# Patient Record
Sex: Female | Born: 1980 | Race: White | Hispanic: Yes | Marital: Single | Smoking: Never smoker
Health system: Southern US, Community
[De-identification: ages and names within clinical notes are randomized; demographics above are authoritative.]

## PROBLEM LIST (undated history)

## (undated) DIAGNOSIS — K649 Unspecified hemorrhoids: Secondary | ICD-10-CM

## (undated) DIAGNOSIS — I8390 Asymptomatic varicose veins of unspecified lower extremity: Secondary | ICD-10-CM

## (undated) DIAGNOSIS — I8290 Acute embolism and thrombosis of unspecified vein: Secondary | ICD-10-CM

## (undated) DIAGNOSIS — R51 Headache: Secondary | ICD-10-CM

## (undated) HISTORY — DX: Acute embolism and thrombosis of unspecified vein: I82.90

## (undated) HISTORY — DX: Asymptomatic varicose veins of unspecified lower extremity: I83.90

## (undated) HISTORY — DX: Headache: R51

---

## 1999-11-03 ENCOUNTER — Encounter: Admission: RE | Admit: 1999-11-03 | Discharge: 1999-11-03 | Payer: Self-pay | Admitting: Family Medicine

## 1999-11-10 ENCOUNTER — Encounter: Admission: RE | Admit: 1999-11-10 | Discharge: 1999-11-10 | Payer: Self-pay | Admitting: Family Medicine

## 2000-04-26 ENCOUNTER — Encounter: Admission: RE | Admit: 2000-04-26 | Discharge: 2000-04-26 | Payer: Self-pay | Admitting: Family Medicine

## 2000-08-23 ENCOUNTER — Encounter (INDEPENDENT_AMBULATORY_CARE_PROVIDER_SITE_OTHER): Payer: Self-pay | Admitting: *Deleted

## 2000-08-23 LAB — CONVERTED CEMR LAB

## 2000-08-31 ENCOUNTER — Other Ambulatory Visit: Admission: RE | Admit: 2000-08-31 | Discharge: 2000-08-31 | Payer: Self-pay | Admitting: Gynecology

## 2000-09-13 ENCOUNTER — Encounter: Admission: RE | Admit: 2000-09-13 | Discharge: 2000-09-13 | Payer: Self-pay | Admitting: Family Medicine

## 2000-09-17 ENCOUNTER — Encounter: Admission: RE | Admit: 2000-09-17 | Discharge: 2000-09-17 | Payer: Self-pay | Admitting: Family Medicine

## 2000-09-17 ENCOUNTER — Encounter: Payer: Self-pay | Admitting: Family Medicine

## 2000-09-27 ENCOUNTER — Encounter: Admission: RE | Admit: 2000-09-27 | Discharge: 2000-09-27 | Payer: Self-pay | Admitting: Family Medicine

## 2001-03-26 ENCOUNTER — Encounter: Payer: Self-pay | Admitting: Emergency Medicine

## 2001-03-26 ENCOUNTER — Emergency Department (HOSPITAL_COMMUNITY): Admission: EM | Admit: 2001-03-26 | Discharge: 2001-03-26 | Payer: Self-pay | Admitting: *Deleted

## 2001-05-02 ENCOUNTER — Encounter: Admission: RE | Admit: 2001-05-02 | Discharge: 2001-05-02 | Payer: Self-pay | Admitting: Family Medicine

## 2001-05-09 ENCOUNTER — Encounter: Admission: RE | Admit: 2001-05-09 | Discharge: 2001-05-09 | Payer: Self-pay | Admitting: Family Medicine

## 2001-06-01 ENCOUNTER — Other Ambulatory Visit: Admission: RE | Admit: 2001-06-01 | Discharge: 2001-06-01 | Payer: Self-pay | Admitting: Gynecology

## 2002-01-16 ENCOUNTER — Encounter: Admission: RE | Admit: 2002-01-16 | Discharge: 2002-01-16 | Payer: Self-pay | Admitting: Sports Medicine

## 2002-04-25 ENCOUNTER — Other Ambulatory Visit: Admission: RE | Admit: 2002-04-25 | Discharge: 2002-04-25 | Payer: Self-pay | Admitting: Gynecology

## 2002-10-09 ENCOUNTER — Encounter: Payer: Self-pay | Admitting: Gynecology

## 2002-10-09 ENCOUNTER — Inpatient Hospital Stay (HOSPITAL_COMMUNITY): Admission: AD | Admit: 2002-10-09 | Discharge: 2002-10-14 | Payer: Self-pay | Admitting: Gynecology

## 2002-10-10 ENCOUNTER — Encounter: Payer: Self-pay | Admitting: Gynecology

## 2002-10-11 ENCOUNTER — Encounter: Payer: Self-pay | Admitting: Gynecology

## 2002-11-24 ENCOUNTER — Other Ambulatory Visit: Admission: RE | Admit: 2002-11-24 | Discharge: 2002-11-24 | Payer: Self-pay | Admitting: Gynecology

## 2003-11-16 ENCOUNTER — Encounter: Admission: RE | Admit: 2003-11-16 | Discharge: 2003-11-16 | Payer: Self-pay | Admitting: Family Medicine

## 2004-02-22 ENCOUNTER — Other Ambulatory Visit: Admission: RE | Admit: 2004-02-22 | Discharge: 2004-02-22 | Payer: Self-pay | Admitting: Gynecology

## 2004-08-12 ENCOUNTER — Ambulatory Visit: Payer: Self-pay | Admitting: Family Medicine

## 2005-02-05 ENCOUNTER — Emergency Department (HOSPITAL_COMMUNITY): Admission: EM | Admit: 2005-02-05 | Discharge: 2005-02-05 | Payer: Self-pay | Admitting: Family Medicine

## 2005-02-24 ENCOUNTER — Other Ambulatory Visit: Admission: RE | Admit: 2005-02-24 | Discharge: 2005-02-24 | Payer: Self-pay | Admitting: Gynecology

## 2005-03-30 ENCOUNTER — Ambulatory Visit (HOSPITAL_COMMUNITY): Admission: RE | Admit: 2005-03-30 | Discharge: 2005-03-30 | Payer: Self-pay | Admitting: Family Medicine

## 2005-03-30 ENCOUNTER — Ambulatory Visit: Payer: Self-pay | Admitting: Family Medicine

## 2006-04-22 DIAGNOSIS — I8390 Asymptomatic varicose veins of unspecified lower extremity: Secondary | ICD-10-CM | POA: Insufficient documentation

## 2006-04-22 DIAGNOSIS — K5900 Constipation, unspecified: Secondary | ICD-10-CM | POA: Insufficient documentation

## 2006-04-22 DIAGNOSIS — K219 Gastro-esophageal reflux disease without esophagitis: Secondary | ICD-10-CM | POA: Insufficient documentation

## 2006-04-22 DIAGNOSIS — G44209 Tension-type headache, unspecified, not intractable: Secondary | ICD-10-CM | POA: Insufficient documentation

## 2006-04-22 DIAGNOSIS — R079 Chest pain, unspecified: Secondary | ICD-10-CM | POA: Insufficient documentation

## 2006-04-23 ENCOUNTER — Encounter (INDEPENDENT_AMBULATORY_CARE_PROVIDER_SITE_OTHER): Payer: Self-pay | Admitting: *Deleted

## 2009-02-05 ENCOUNTER — Encounter: Payer: Self-pay | Admitting: Family Medicine

## 2009-02-05 ENCOUNTER — Ambulatory Visit: Payer: Self-pay | Admitting: Family Medicine

## 2009-02-05 LAB — CONVERTED CEMR LAB
Antibody Screen: NEGATIVE
Basophils Absolute: 0 10*3/uL (ref 0.0–0.1)
Basophils Relative: 0 % (ref 0–1)
Eosinophils Absolute: 0.1 10*3/uL (ref 0.0–0.7)
Eosinophils Relative: 1 % (ref 0–5)
HCT: 40.8 % (ref 36.0–46.0)
Hemoglobin: 13.2 g/dL (ref 12.0–15.0)
Hepatitis B Surface Ag: NEGATIVE
Lymphocytes Relative: 19 % (ref 12–46)
Lymphs Abs: 1.4 10*3/uL (ref 0.7–4.0)
MCHC: 32.4 g/dL (ref 30.0–36.0)
MCV: 79.8 fL (ref 78.0–100.0)
Monocytes Absolute: 0.4 10*3/uL (ref 0.1–1.0)
Monocytes Relative: 5 % (ref 3–12)
Neutro Abs: 5.7 10*3/uL (ref 1.7–7.7)
Neutrophils Relative %: 75 % (ref 43–77)
Platelets: 182 10*3/uL (ref 150–400)
RBC: 5.11 M/uL (ref 3.87–5.11)
RDW: 13.2 % (ref 11.5–15.5)
Rh Type: POSITIVE
Rubella: >500 intl units/mL — ABNORMAL HIGH
Sickle Cell Screen: NEGATIVE
WBC: 7.7 10*3/uL (ref 4.0–10.5)

## 2009-02-06 ENCOUNTER — Encounter: Payer: Self-pay | Admitting: Family Medicine

## 2009-02-12 ENCOUNTER — Ambulatory Visit: Payer: Self-pay | Admitting: Family Medicine

## 2009-02-12 ENCOUNTER — Encounter: Payer: Self-pay | Admitting: Family Medicine

## 2009-02-12 LAB — CONVERTED CEMR LAB

## 2009-03-14 ENCOUNTER — Ambulatory Visit: Payer: Self-pay | Admitting: Family Medicine

## 2009-03-14 ENCOUNTER — Encounter: Payer: Self-pay | Admitting: Family Medicine

## 2009-03-22 ENCOUNTER — Telehealth: Payer: Self-pay | Admitting: *Deleted

## 2009-03-28 ENCOUNTER — Ambulatory Visit: Payer: Self-pay | Admitting: Family Medicine

## 2009-04-11 ENCOUNTER — Ambulatory Visit: Payer: Self-pay | Admitting: Family Medicine

## 2009-04-18 ENCOUNTER — Ambulatory Visit: Payer: Self-pay | Admitting: Obstetrics and Gynecology

## 2009-04-25 ENCOUNTER — Ambulatory Visit: Payer: Self-pay | Admitting: Obstetrics & Gynecology

## 2009-05-02 ENCOUNTER — Ambulatory Visit (HOSPITAL_COMMUNITY): Admission: RE | Admit: 2009-05-02 | Discharge: 2009-05-02 | Payer: Self-pay | Admitting: Family Medicine

## 2009-05-09 ENCOUNTER — Ambulatory Visit: Payer: Self-pay | Admitting: Obstetrics & Gynecology

## 2009-05-16 ENCOUNTER — Ambulatory Visit: Payer: Self-pay | Admitting: Obstetrics & Gynecology

## 2009-05-23 ENCOUNTER — Ambulatory Visit: Payer: Self-pay | Admitting: Obstetrics & Gynecology

## 2009-05-30 ENCOUNTER — Ambulatory Visit: Payer: Self-pay | Admitting: Obstetrics and Gynecology

## 2009-06-06 ENCOUNTER — Ambulatory Visit: Payer: Self-pay | Admitting: Obstetrics & Gynecology

## 2009-06-13 ENCOUNTER — Ambulatory Visit: Payer: Self-pay | Admitting: Obstetrics and Gynecology

## 2009-06-20 ENCOUNTER — Ambulatory Visit: Payer: Self-pay | Admitting: Obstetrics & Gynecology

## 2009-06-27 ENCOUNTER — Ambulatory Visit: Payer: Self-pay | Admitting: Obstetrics and Gynecology

## 2009-07-04 ENCOUNTER — Encounter: Payer: Self-pay | Admitting: Obstetrics & Gynecology

## 2009-07-04 ENCOUNTER — Ambulatory Visit: Payer: Self-pay | Admitting: Family Medicine

## 2009-07-04 LAB — CONVERTED CEMR LAB
HCT: 38 % (ref 36.0–46.0)
MCHC: 31.6 g/dL (ref 30.0–36.0)
MCV: 81.5 fL (ref 78.0–100.0)
Platelets: 163 10*3/uL (ref 150–400)
RDW: 14.2 % (ref 11.5–15.5)

## 2009-07-11 ENCOUNTER — Ambulatory Visit: Payer: Self-pay | Admitting: Obstetrics & Gynecology

## 2009-07-18 ENCOUNTER — Ambulatory Visit: Payer: Self-pay | Admitting: Family Medicine

## 2009-07-25 ENCOUNTER — Ambulatory Visit: Payer: Self-pay | Admitting: Obstetrics & Gynecology

## 2009-08-01 ENCOUNTER — Ambulatory Visit: Payer: Self-pay | Admitting: Family Medicine

## 2009-08-08 ENCOUNTER — Ambulatory Visit: Payer: Self-pay | Admitting: Family Medicine

## 2009-08-15 ENCOUNTER — Ambulatory Visit: Payer: Self-pay | Admitting: Obstetrics & Gynecology

## 2009-08-22 ENCOUNTER — Ambulatory Visit: Payer: Self-pay | Admitting: Obstetrics & Gynecology

## 2009-08-29 ENCOUNTER — Ambulatory Visit: Payer: Self-pay | Admitting: Family Medicine

## 2009-08-29 ENCOUNTER — Encounter: Payer: Self-pay | Admitting: Family Medicine

## 2009-08-30 ENCOUNTER — Encounter: Payer: Self-pay | Admitting: Family Medicine

## 2009-09-05 ENCOUNTER — Ambulatory Visit: Payer: Self-pay | Admitting: Family Medicine

## 2009-09-12 ENCOUNTER — Ambulatory Visit: Payer: Self-pay | Admitting: Family Medicine

## 2009-09-14 ENCOUNTER — Inpatient Hospital Stay (HOSPITAL_COMMUNITY): Admission: AD | Admit: 2009-09-14 | Discharge: 2009-09-16 | Payer: Self-pay | Admitting: Obstetrics & Gynecology

## 2009-09-14 ENCOUNTER — Ambulatory Visit: Payer: Self-pay | Admitting: Physician Assistant

## 2009-10-22 ENCOUNTER — Ambulatory Visit: Payer: Self-pay | Admitting: Family Medicine

## 2009-10-22 ENCOUNTER — Encounter: Payer: Self-pay | Admitting: Family Medicine

## 2010-03-25 NOTE — Assessment & Plan Note (Signed)
Summary: OB F/U [facility]   Vital Signs:  Patient profile:   30 year old female Weight:      157 pounds BP sitting:   128 / 75  Habits & Providers  Alcohol-Tobacco-Diet     Cigarette Packs/Day: n/a  Allergies: No Known Drug Allergies   Impression & Recommendations:  Problem # 1:  PREGNANT STATE, INCIDENTAL (ICD-V22.2) Assessment Unchanged Pt missed apt with [facility] due to not having a working phone.  She gave [place] a new number and will be rescheduled for [facility].  no changes in care today.  doing well.  complaining of nausea but did not want a prescription.  recommended ginger tea or lozenges. Orders: Other OB visit- [facility] ([facility])  Complete Medication List: 1)  Pyridoxine Hcl 25 Mg Tabs (Pyridoxine hcl) .... Take one qid 2)  Colace 100 Mg Caps (Docusate sodium) .... Take one daily for constipation 3)  Tums 500 Mg Chew (Calcium carbonate antacid) .... Take 2 as needed heartburn  Patient Instructions: 1)  please follow up with the high risk clinic at [facility] hospital. 2)  Try ginger tea for your reflux 3)  The nurse will call you tomorrow with your apt for the other clinic. 4)  por favor, siga con la clnica de alto riesgo en [facility] [place]. 5)   Trate de t de jengibre para el reflujo 6)   La enfermera le llamar maana con el apto para la otra clnica.  Prenatal Visit Concerns noted: some discharge that is not bothersome no itching no strong odor   Flowsheet View for Follow-up Visit    Estimated weeks of       gestation:     12 3/7    Weight:     157    Blood pressure:   128 / 75    Headache:     few    Nausea/vomiting:   nausea    Edema:     0    Vaginal bleeding:   no    Vaginal discharge:   d/c    Fundal height:      11    FHR:       150s    Labor symptoms:   no    Fetal position:     N/A    Taking prenatal vits?   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  vitals in flowsheet are wrong.  pts cell number is [id number], not [id number].

## 2010-03-25 NOTE — Letter (Signed)
Summary: *Referral Letter  [facility]  [address]   [place], [place] [address]   Phone: [phone]  Fax: [phone]    03/14/2009  Thank you in advance for agreeing to see my patient:  Wilbert Schouten Revuelta [address] [place], [place]  [address]  Phone: [phone]  Reason for Referral:  Pt is a G3P0202 at 12 weeks with a hx of an induction at 33 weeks for oligo.  Dr. [place] has spoken with Dr. Penne Lash, who would like her to transfer to [facility].      Current Medical Problems: 1)  PREGNANT STATE, INCIDENTAL (ICD-V22.2) 2)  VARICOSE VEINS (ICD-454.9) 3)  TENSION HEADACHE (ICD-307.81) 4)  GASTROESOPHAGEAL REFLUX, NO ESOPHAGITIS (ICD-530.81) 5)  CONSTIPATION (ICD-564.0)    Current Medications: 1)  PYRIDOXINE HCL 25 MG TABS (PYRIDOXINE HCL) take one QID 2)  COLACE 100 MG CAPS (DOCUSATE SODIUM) take one daily for constipation 3)  TUMS 500 MG CHEW (CALCIUM CARBONATE ANTACID) take 2 as needed heartburn   Past Medical History: 1)  [id number] at [redacted] weeks gestation by LMP.  Current EDC is 09/23/09    Pertinent Labs:  prenatal labs are normal, please see attached.  Thank you again for agreeing to see our patient; please contact us if you have any further questions or need additional information.  Sincerely,  Ellery Plunk MD

## 2010-03-25 NOTE — Progress Notes (Signed)
Summary: Kristen Davila   Phone Note Call from Patient Call back at [phone]   Caller: [name] Summary of Call: Checking for pt who said she was to be referred to the High Risk Clinic. Initial call taken by: Clydell Hakim,  March 22, 2009 9:12 AM  Follow-up for Phone Call        Appt rescheduled for 1.3.11 @ 8:30am.  Attempted to call [name], but no answer and unable to leave VM.  Will try again later Follow-up by: Jone Baseman CMA,  March 25, 2009 10:38 AM  Additional Follow-up for Phone Call Additional follow up Details #1::        LMOVM for callback Additional Follow-up by: Jone Baseman CMA,  March 25, 2009 11:33 AM    Additional Follow-up for Phone Call Additional follow up Details #2::    [name] informed Follow-up by: Jone Baseman CMA,  March 25, 2009 2:14 PM

## 2010-03-25 NOTE — Miscellaneous (Signed)
Summary: Procedures consent  Procedures consent   Imported By: De Nurse 11/05/2009 11:41:55  _____________________________________________________________________  External Attachment:    Type:   Image     Comment:   External Document

## 2010-03-25 NOTE — Assessment & Plan Note (Signed)
Summary: postpartum,tcb  Lot # of Implanon inserted today: [id number]  exp Feb 2014  Dennison Nancy RN  October 22, 2009 3:05 PM  Vital Signs:  Patient profile:   30 year old female Weight:      153.5 pounds Temp:     98.7 degrees F Pulse rate:   78 / minute BP sitting:   114 / 75  (right arm) Cuff size:   regular  Vitals Entered By: Dennison Nancy RN (October 22, 2009 1:35 PM) CC: Post partum Is Patient Diabetic? No Pain Assessment Patient in pain? no        Primary Care Provider:  Ellery Plunk MD  CC:  Post partum.  History of Present Illness: PP check.  has not had sex.  lochia light and brown.  did not have depo shot at hospital but has been on depo in the past.  would like implanon today.  has a friend that has one and knows about it.    PHQ9- completely normal with no concerns noted.  feels she has good support at home.  breast and bottle feeding  Habits & Providers  Alcohol-Tobacco-Diet     Tobacco Status: never     Cigarette Packs/Day: n/a  Current Medications (verified): 1)  Pyridoxine Hcl 25 Mg Tabs (Pyridoxine Hcl) .... Take One Qid 2)  Colace 100 Mg Caps (Docusate Sodium) .... Take One Daily For Constipation 3)  Tums 500 Mg Chew (Calcium Carbonate Antacid) .... Take 2 As Needed Heartburn 4)  Implanon 68 Mg Impl (Etonogestrel)  Allergies (verified): No Known Drug Allergies  Past History:  Past Medical History: G2P2 NSVD implanon left arm placed 10/22/09  Social History: Smoking Status:  never  Review of Systems  The patient denies anorexia, fever, chest pain, and syncope.    Physical Exam  General:  Well-developed,well-nourished,in no acute distress; alert,appropriate and cooperative throughout examination Head:  Normocephalic and atraumatic without obvious abnormalities. No apparent alopecia or balding. Lungs:  Normal respiratory effort, chest expands symmetrically. Lungs are clear to auscultation, no crackles or wheezes. Heart:  Normal rate  and regular rhythm. S1 and S2 normal without gallop, murmur, click, rub or other extra sounds. Abdomen:  Bowel sounds positive,abdomen soft and non-tender without masses, organomegaly or hernias noted. Genitalia:  Pelvic Exam:        External: normal female genitalia without lesions or masses        Vagina: normal without lesions or masses        Cervix: normal without lesions or masses        Adnexa: normal bimanual exam without masses or fullness        Uterus: normal by palpation        Pap smear: not performed Additional Exam:  implanon placement in left arm.  informed consent done.  Dr. Deirdre Priest present.  area cleaned with alcohol and 1.21ml 1% lidocaine injected.  area cleaned with betadine, 11 blade scalpel used to make 5mm incisition.  implanon inserted immediately subcutaneously and easily palpable.   pressure applied and then pressure dressing applied.   Impression & Recommendations:  Problem # 1:  PREGNANT STATE, INCIDENTAL (ICD-V22.2) Assessment Improved PP check ok.  routine PP information reviewed.  Br/Bo feeding.  no evidence of depression.  will follow up as needed. [id number]  Problem # 2:  CONTRACEPTIVE MANAGEMENT (ICD-V25.09) Assessment: New implanon placed today with no complications Orders: Postpartum visit- [facility] ([id number]) Etonogestrel implant system, including implant and supplie (O1308)  Complete Medication  List: 1)  Pyridoxine Hcl 25 Mg Tabs (Pyridoxine hcl) .... Take one qid 2)  Colace 100 Mg Caps (Docusate sodium) .... Take one daily for constipation 3)  Tums 500 Mg Chew (Calcium carbonate antacid) .... Take 2 as needed heartburn 4)  Implanon 68 Mg Impl (Etonogestrel)

## 2010-04-07 ENCOUNTER — Encounter: Payer: Self-pay | Admitting: *Deleted

## 2010-05-10 LAB — POCT URINALYSIS DIP (DEVICE)
Glucose, UA: NEGATIVE mg/dL
Nitrite: NEGATIVE
Specific Gravity, Urine: 1.015 (ref 1.005–1.030)
Urobilinogen, UA: 0.2 mg/dL (ref 0.0–1.0)

## 2010-05-10 LAB — CBC
Hemoglobin: 12.1 g/dL (ref 12.0–15.0)
Platelets: 145 10*3/uL — ABNORMAL LOW (ref 150–400)
RBC: 4.4 MIL/uL (ref 3.87–5.11)
RBC: 5.26 MIL/uL — ABNORMAL HIGH (ref 3.87–5.11)
WBC: 8 10*3/uL (ref 4.0–10.5)
WBC: 8.1 10*3/uL (ref 4.0–10.5)

## 2010-05-10 LAB — RPR: RPR Ser Ql: NONREACTIVE

## 2010-05-11 LAB — POCT URINALYSIS DIP (DEVICE)
Bilirubin Urine: NEGATIVE
Glucose, UA: NEGATIVE mg/dL
Hgb urine dipstick: NEGATIVE
Nitrite: NEGATIVE
Specific Gravity, Urine: 1.025 (ref 1.005–1.030)

## 2010-05-12 LAB — POCT URINALYSIS DIP (DEVICE)
Bilirubin Urine: NEGATIVE
Hgb urine dipstick: NEGATIVE
Ketones, ur: NEGATIVE mg/dL
pH: 7.5 (ref 5.0–8.0)

## 2010-05-13 LAB — POCT URINALYSIS DIP (DEVICE)
Bilirubin Urine: NEGATIVE
Ketones, ur: NEGATIVE mg/dL
Protein, ur: NEGATIVE mg/dL
Specific Gravity, Urine: 1.02 (ref 1.005–1.030)

## 2010-05-14 LAB — POCT URINALYSIS DIP (DEVICE)
Bilirubin Urine: NEGATIVE
Glucose, UA: NEGATIVE mg/dL
Glucose, UA: NEGATIVE mg/dL
Hgb urine dipstick: NEGATIVE
Nitrite: NEGATIVE
Nitrite: NEGATIVE
Protein, ur: NEGATIVE mg/dL
Specific Gravity, Urine: 1.01 (ref 1.005–1.030)
Urobilinogen, UA: 0.2 mg/dL (ref 0.0–1.0)
Urobilinogen, UA: 0.2 mg/dL (ref 0.0–1.0)
pH: 7 (ref 5.0–8.0)

## 2010-05-18 LAB — POCT URINALYSIS DIP (DEVICE)
Bilirubin Urine: NEGATIVE
Nitrite: NEGATIVE
Protein, ur: NEGATIVE mg/dL
pH: 7 (ref 5.0–8.0)

## 2010-06-18 ENCOUNTER — Telehealth: Payer: Self-pay | Admitting: *Deleted

## 2010-06-18 NOTE — Telephone Encounter (Signed)
Patient calls and communication is thru [employer]. Patient states  6 months ago she had vaginal bleeding for one month . She also had dizziness and headache at that time. Now she has had heavy   bleeding for 2 weeks  And has dizziness again . She states she uses 5 pads daily. Consulted with Dr. Mauricio Po  We are unable to offer appointment here today but suggested she go to Urgent Care today , however she is unable to do this and asks for appointment tomorrow. Offered her appointment tomorrow AM but she cannot come until after 2:00 PM because she has to go to work. Appointment scheduled tomorow PM with Dr. Edmonia James. advised if worsens to go to Urgent Care .

## 2010-06-19 ENCOUNTER — Encounter: Payer: Self-pay | Admitting: Family Medicine

## 2010-06-19 ENCOUNTER — Ambulatory Visit (INDEPENDENT_AMBULATORY_CARE_PROVIDER_SITE_OTHER): Payer: Self-pay | Admitting: Family Medicine

## 2010-06-19 VITALS — BP 92/64 | HR 80 | Temp 98.7°F | Ht 61.0 in | Wt 154.0 lb

## 2010-06-19 DIAGNOSIS — N898 Other specified noninflammatory disorders of vagina: Secondary | ICD-10-CM

## 2010-06-19 DIAGNOSIS — N939 Abnormal uterine and vaginal bleeding, unspecified: Secondary | ICD-10-CM

## 2010-06-19 LAB — CBC
MCHC: 33.1 g/dL (ref 30.0–36.0)
RDW: 13.1 % (ref 11.5–15.5)
WBC: 6 10*3/uL (ref 4.0–10.5)

## 2010-06-19 LAB — TSH: TSH: 1.843 u[IU]/mL (ref 0.350–4.500)

## 2010-06-19 NOTE — Progress Notes (Signed)
  Subjective:    Patient ID: Kristen Davila, female    DOB: 07-Apr-1980, 30 y.o.   MRN: [id number]  HPI Heavy bleeding: After insertion of implanon- did not have a period for 6 months, then had a period that lasted 2 months- medium to heavy flow. Then did not have menustration for 1 month.  The past 2 weeks has had a period x 2 weeks- medium to heavy flow.  Occasional lower abd cramping.   Off and on blurry vision x 1 week.  Dizziness off and on x 1 week.  H/a off and on x 1weeks.  No longer breastfeeding.  Pt states that she is concerned she may be losing to much blood and may be anemic.  Ask that I check her blood work.  Review of Systems    as per above Objective:   Physical Exam  Constitutional: She is oriented to person, place, and time. She appears well-developed and well-nourished.  Neck: No thyromegaly present.  Cardiovascular: Normal rate and regular rhythm.   No murmur heard. Pulmonary/Chest: Effort normal and breath sounds normal. No respiratory distress. She has no wheezes.  Abdominal: Soft. Bowel sounds are normal. She exhibits no distension. There is no tenderness. There is no rebound.  Musculoskeletal:       Normal gait  Neurological: She is alert and oriented to person, place, and time.  Skin: Skin is warm and dry. No rash noted.  Psychiatric: She has a normal mood and affect. Her behavior is normal. Judgment and thought content normal.          Assessment & Plan:

## 2010-06-19 NOTE — Patient Instructions (Signed)
Vamos hacer una prueba de sangre para chequear por la anemia y problems de la thyroide.   Voy a llamar o enviar los resultado en [place] Dear carta a su casa.  Probablemente el implanon es causando este sangrando.  Si siga el sangrando es posible que necesitara cambiar su forma de cuidarse.

## 2010-06-19 NOTE — Assessment & Plan Note (Signed)
Most likely bleeding is 2/2 implanon form of birth control.  Will check pt CBC (to check for anemia) and will check TSH (can cause changes in menustration).  Encouraged pt to try for a few more months if she at that time continues to have abnormal bleeding she may want to consider trying another form of birth control and having the implanon removed.

## 2010-06-20 ENCOUNTER — Encounter: Payer: Self-pay | Admitting: Family Medicine

## 2010-07-11 NOTE — Discharge Summary (Signed)
NAME:  Kristen Davila, Kristen Davila                       ACCOUNT NO.:  [id number]   MEDICAL RECORD NO.:  [id number]                   PATIENT TYPE:  INP   LOCATION:  9105                                 FACILITY:  [facility]   PHYSICIAN:  Timothy P. Fontaine, M.D.           DATE OF BIRTH:  [birth year]   DATE OF ADMISSION:  10/09/2002  DATE OF DISCHARGE:  10/14/2002                                 DISCHARGE SUMMARY   DISCHARGE DIAGNOSES:  Intrauterine pregnancy at [redacted] weeks gestation with  oligohydramnios.   PROCEDURE:  Vaginal delivery of a viable female with no episiotomy.   HISTORY OF PRESENT ILLNESS:  A 30 year old gravida 2, para 1 at [redacted] weeks  gestation.  Initially underwent an ultrasound at 32 weeks for size less than  dates.  Pregnancy had been uncomplicated to date.  Ultrasound at that time  showed less than 10 percentile estimated fetal weight with an AFI of 8.7.  Follow-up AFI was noted to be 11 and she presented today for recheck of her  AFI and was found to have 4.9.  She did have a reactive NST.  She had denied  leakage of fluid and was admitted to the hospital for hydration, rest,  steroid administration, and Doppler flow.  AFP was noted to be in the normal  range and initial ultrasound at 9 weeks was consistent with her EDC.  She  had a subsequent ultrasound that showed an AFI of 1.5.  She was then given  betamethasone and Cervidil/Pitocin to induce labor.   LABORATORIES:  Blood type is AB+.  Antibody negative.  Serology nonreactive.  Rubella titer positive.  Hepatitis nonreactive.  HIV nonreactive.   HOSPITAL COURSE:  She was induced with Cervidil, Pitocin, and AROM.  She  also had an Amnioinfusion.  She had a spontaneous delivery of a viable  female with Apgars of 8 and 9 with a birth weight of 4 pounds 1.1 ounces.  She did well postpartum.  She remained afebrile, voiding, and was discharged  home in satisfactory condition on her second postpartum day.   DISCHARGE LABORATORIES:   White count 11.9, hemoglobin 12.6, hematocrit 36.3.  Factor V Leiden was pending.  Lupus anticoagulant negative.  Parvovirus was  positive.  The IgG and the IgM were negative.  The TORCH panel:  The rubella  was positive.  Rubella antibody IgG was positive.  CMV IgG antibody was  positive.  Herpes simplex antibody IgG was positive.  Toxoplasma AB IgG was  negative.  ANA was negative and anticardiolipin antibody was negative.  Anticardiolipin IgG was negative.   DISPOSITION:  She was discharged to home.  Instructed to follow up in six  weeks or needed.  Continue prenatal vitamins and iron.  Motrin as needed for  pain.  GGA discharge booklet.  Husband did interpret for wife.  There was a  language barrier.     Davonna Belling. Young, N.P.  Timothy P. Audie Box, M.D.    [place] [facility]  D:  11/03/2002  T:  11/03/2002  Job:  [id number]

## 2010-07-11 NOTE — H&P (Signed)
   Kristen Davila                       ACCOUNT NO.:  [id number]   MEDICAL RECORD NO.:  [id number]                   PATIENT TYPE:  INP   LOCATION:  9152                                 FACILITY:  [facility]   PHYSICIAN:  Timothy P. Fontaine, M.D.           DATE OF BIRTH:  Apr 25, 1980   DATE OF ADMISSION:  10/09/2002  DATE OF DISCHARGE:                                HISTORY & PHYSICAL   CHIEF COMPLAINT:  1. Pregnancy at 33 weeks.  2. Oligohydramnios.   HISTORY OF PRESENT ILLNESS:  A 30 year old G63, P54 female at [redacted] weeks  gestation, initially underwent ultrasound at 32 weeks due to size less than  dates.  Her pregnancy had been uncomplicated to date.  Ultrasound at that  time showed less than 10th percentile estimated fetal weight with AFI of  8.7.  A follow-up AFI was noted to be 11 and she represented today for a  recheck of her AFI and was found to have an AFI of 4.9.  She has a reactive  NST.  The patient denies leakage of fluid vaginally and she is admitted at  this time for hydration, rest, steroid administration and Doppler flow  studies.  Her  AFP was noted to be within normal range and her initial  ultrasound at nine weeks was consistent with her EDC.   PAST MEDICAL HISTORY:  She has no significant past medical history.  Her  glucose screening was normal.   PAST SURGICAL HISTORY:  Uncomplicated.   PAST OBSTETRICAL HISTORY:  Delivered at 37 weeks, 6 pound 6 ounce, vaginal.   For the remainder of her history, see her [place].   PHYSICAL EXAMINATION:  HEENT:  Normal.  LUNGS:  Clear.  CARDIOVASCULAR:  Regular rate without murmurs, rubs, or gallops.  ABDOMEN:  Fundal height 30 cm, positive fetal heart tone.  PELVIC:  Cervix is closed with negative pooling, Nitrazine and negative  ferning.   ASSESSMENT:  1. At 33 weeks.  2. Oligohydramnios.  3. Small for gestational age.  4. Questionable etiology.    PLAN:  Admit.  IV hydrate, administer steroids, accelerate  lung maturity.  Baseline ultrasound at the hospital with Doppler flows.  Will plan on  expectant management assuming fluid remains acceptable and antepartum  testing reassuring.  If not, then will proceed with delivery.  Plan was  discussed with the patient through the father of the baby who interpreted  for [place].                                               Timothy P. Audie Box, M.D.    TPF/MEDQ  D:  10/09/2002  T:  10/09/2002  Job:  [id number]

## 2011-03-03 ENCOUNTER — Encounter: Payer: Self-pay | Admitting: Family Medicine

## 2011-03-03 ENCOUNTER — Ambulatory Visit (INDEPENDENT_AMBULATORY_CARE_PROVIDER_SITE_OTHER): Payer: Self-pay | Admitting: Family Medicine

## 2011-03-03 VITALS — BP 93/61 | HR 83 | Temp 98.8°F | Ht 61.5 in | Wt 149.7 lb

## 2011-03-03 DIAGNOSIS — IMO0002 Reserved for concepts with insufficient information to code with codable children: Secondary | ICD-10-CM

## 2011-03-03 DIAGNOSIS — R1011 Right upper quadrant pain: Secondary | ICD-10-CM | POA: Insufficient documentation

## 2011-03-03 DIAGNOSIS — Z23 Encounter for immunization: Secondary | ICD-10-CM

## 2011-03-03 DIAGNOSIS — M7050 Other bursitis of knee, unspecified knee: Secondary | ICD-10-CM

## 2011-03-03 DIAGNOSIS — Z0000 Encounter for general adult medical examination without abnormal findings: Secondary | ICD-10-CM | POA: Insufficient documentation

## 2011-03-03 NOTE — Patient Instructions (Signed)
gracias por venir hoy  usted ser debidamente la prueba de [place] en un ao  Me gustara volver en 2 semanas para que los resultados de su ultrasonido  la rodilla est un poco hinchada, pero se puede tratar de que con hielo o Motrin cuando se pone doloroso  si se pone muy mal, vuelve y lo vamos a Tourist information centre manager

## 2011-03-03 NOTE — Progress Notes (Signed)
  Subjective:    Patient ID: Kristen Davila, female    DOB: Aug 03, 1980, 31 y.o.   MRN: [id number]  HPI Patient is here for a well visit. She is still using her Implanon and is to have complaints about this today. Overall she feels well except for a few complaints. Right-sided abdominal pain-this pain seems to start her back and radiated to the front. His been on for 5 years but seems to be getting worse recently. Her last 2 days at a time. It seems to start when she eats fatty foods. This is happened twice this month. It is a sharp and strong pain when she gets it. She denies any fevers, diarrhea, nausea, vomiting. She reports constipation.  Knee pain-patient has left knee pain in the peds anserine area. She says that it feels more swollen recently. It seems a still born she stands up for long periods of time. Does not hurt her to move it and she has not had any recent injury to the area.  Vaginal bumps-patient has 2 spots that she has noticed in the last year. They don't seem to be growing or changing. They're not itchy or painful. She's not had any discharges bothersome and she has had no new partners.    Review of Systems See above    Objective:   Physical Exam  Vital signs reviewed General appearance - alert, well appearing, and in no distress and oriented to person, place, and time Heart - normal rate, regular rhythm, normal S1, S2, no murmurs, rubs, clicks or gallops Chest - clear to auscultation, no wheezes, rales or rhonchi, symmetric air entry, no tachypnea, retractions or cyanosis Abdomen - soft, nontender, nondistended, no masses or organomegaly GYN-external exam significant for 2 small bumps. One at 12:00 appears to be a mole with some pigmentation. There is no surrounding redness. Some of the work. There is also a small lump at 9:00 on the right labia. This has a smooth surface. It is light colored. It appears to be an inclusion cyst      Assessment & Plan:

## 2011-03-03 NOTE — Assessment & Plan Note (Signed)
Intermittent right upper quadrant pain that is getting worse. We'll investigate for signs of gallstones. To return to see me in 2 weeks for followup

## 2011-03-03 NOTE — Assessment & Plan Note (Signed)
Motrin and ice for swelling. To return if worse for injection.

## 2011-03-03 NOTE — Assessment & Plan Note (Signed)
Doing well, no changes today 

## 2011-03-05 ENCOUNTER — Ambulatory Visit (HOSPITAL_COMMUNITY)
Admission: RE | Admit: 2011-03-05 | Discharge: 2011-03-05 | Disposition: A | Discharge status: Home / Self Care | Payer: Self-pay | Source: Ambulatory Visit | Attending: Family Medicine | Admitting: Family Medicine

## 2011-03-05 ENCOUNTER — Other Ambulatory Visit (HOSPITAL_COMMUNITY): Payer: Self-pay

## 2011-03-05 DIAGNOSIS — R1011 Right upper quadrant pain: Secondary | ICD-10-CM | POA: Insufficient documentation

## 2011-03-05 DIAGNOSIS — K5900 Constipation, unspecified: Secondary | ICD-10-CM | POA: Insufficient documentation

## 2011-03-05 DIAGNOSIS — K219 Gastro-esophageal reflux disease without esophagitis: Secondary | ICD-10-CM | POA: Insufficient documentation

## 2011-03-13 ENCOUNTER — Telehealth: Payer: Self-pay | Admitting: Family Medicine

## 2011-03-13 NOTE — Telephone Encounter (Signed)
Message copied by Reginold Agent on Fri Mar 13, 2011 11:19 AM ------      Message from: Tivis Ringer      Created: Thu Mar 05, 2011  9:21 AM                   ----- Message -----         From: Rad Results In Interface         Sent: 03/05/2011   8:51 AM           To: Sanjuana Letters

## 2011-03-13 NOTE — Telephone Encounter (Signed)
Please let pt know that her ultrasound was normal. She does not have any sign of gallbladder disease. She should come in if she is still having abdominal pain that is bothersome to her.

## 2011-03-16 ENCOUNTER — Telehealth: Payer: Self-pay | Admitting: Family Medicine

## 2011-03-16 NOTE — Telephone Encounter (Signed)
See previous note for info.Kristen Davila, Kristen Davila

## 2011-03-16 NOTE — Telephone Encounter (Signed)
I called a pt and let a VM, Pt should call us back. [employer]

## 2011-03-17 ENCOUNTER — Telehealth: Payer: Self-pay | Admitting: Family Medicine

## 2011-03-17 NOTE — Telephone Encounter (Signed)
Pt call us back and I explaing Dr's note. Pt is ok with it and will call us in case that needs appt. [employer]

## 2011-05-29 ENCOUNTER — Ambulatory Visit (INDEPENDENT_AMBULATORY_CARE_PROVIDER_SITE_OTHER): Payer: Self-pay | Admitting: Family Medicine

## 2011-05-29 ENCOUNTER — Encounter: Payer: Self-pay | Admitting: Family Medicine

## 2011-05-29 VITALS — BP 104/70 | HR 72 | Temp 98.6°F | Ht 61.5 in | Wt 150.0 lb

## 2011-05-29 DIAGNOSIS — L814 Other melanin hyperpigmentation: Secondary | ICD-10-CM | POA: Insufficient documentation

## 2011-05-29 DIAGNOSIS — L709 Acne, unspecified: Secondary | ICD-10-CM | POA: Insufficient documentation

## 2011-05-29 DIAGNOSIS — L819 Disorder of pigmentation, unspecified: Secondary | ICD-10-CM

## 2011-05-29 DIAGNOSIS — L708 Other acne: Secondary | ICD-10-CM

## 2011-05-29 MED ORDER — CLINDAMYCIN PHOS-BENZOYL PEROX 1.2-5 % EX GEL: CUTANEOUS | Status: DC

## 2011-05-29 NOTE — Patient Instructions (Signed)
For the acne: -Wash your face with gentle cleanser (like Dove) -Apply facial lotion without oil (like Aveeno) -Use sunscreen -Try benzoyl peroxide 10% that you can get over-the-counter once a day (at night) -Try duac gel once a day (in the morning)  For the dark spots on your face: -They are sun spots.  -Avoid sun: wear a hat and sunscreen  Follow-up in 4-6 weeks if your acne is not better.

## 2011-05-29 NOTE — Progress Notes (Signed)
  Subjective:    Patient ID: Kristen Davila, female    DOB: 1980-06-07, 31 y.o.   MRN: [id number]  HPI Acute visit: dark spots on face and acne  1. Dark spots on face It has been there for a while, and patient would like them removed. May be increasing in size, but difficult to tell.  She has not tried anything for the spots.  2. Acne Seems to be worsening recently.  She has a history of adult acne.  She denies being more stressed than usual, but reports decreased water intake. She uses an oil-free cleanser (could not recall name) and does not use any lotions.  She is not using any medication currently for acne, including topicals.   Review of Systems Per HPI.     Objective:   Physical Exam Gen: NAD Skin:   Face:      Hyperpigmented light-brown, homogeneous macular areas of pigmentation on cheek bones bilaterally, 1-3cm wide     About 5-10 acne pustules but no cysts on forehead, upper lip area, and chin    Assessment & Plan:

## 2011-05-29 NOTE — Assessment & Plan Note (Signed)
Patient has history of adult acne but is having more lesions recently. Recommended diet (avoid low glycemic index foods, drinking more water), hygiene (mild lotions and moisturizer), and topical medications (benzoyl peroxide and antibiotic twice a day). Consider oral antibiotic if symptoms unchanged or worse after 4-6 weeks; patient advised to follow-up prn.

## 2011-05-29 NOTE — Assessment & Plan Note (Signed)
On bilateral cheekbones.  Recommended good sun protection (hat, lotion). Discussed how topicals not effective for these lesions. Cosmetic dermatologist may be able to remove them with lasers or chemical peels, but patient has an [employer] and is unable to afford these services.  Discussed how lesions are benign, but to come in for re-evaluation if lesions start to change colors or are growing larger.

## 2011-10-07 ENCOUNTER — Ambulatory Visit (INDEPENDENT_AMBULATORY_CARE_PROVIDER_SITE_OTHER): Payer: Self-pay | Admitting: Family Medicine

## 2011-10-07 ENCOUNTER — Encounter: Payer: Self-pay | Admitting: Family Medicine

## 2011-10-07 VITALS — BP 101/65 | HR 64 | Temp 98.2°F | Ht 61.5 in | Wt 141.0 lb

## 2011-10-07 DIAGNOSIS — R1013 Epigastric pain: Secondary | ICD-10-CM

## 2011-10-07 DIAGNOSIS — L819 Disorder of pigmentation, unspecified: Secondary | ICD-10-CM

## 2011-10-07 DIAGNOSIS — L814 Other melanin hyperpigmentation: Secondary | ICD-10-CM

## 2011-10-07 NOTE — Assessment & Plan Note (Signed)
Complaining of a cold sensation (not pain) when she inhales. No GI or other pulmonary symptoms.  -Will check TSH -If normal, advised patient monitor symptoms. Does not appear worrisome. Given indications to RTC: chest pain, wheezing, nausea/vomiting

## 2011-10-07 NOTE — Patient Instructions (Addendum)
We will refer you to a dermatologist. It may take a few months for you to be seen.  If your lab results are normal, I will send you a letter with the results. If abnormal, someone at the clinic will get in touch with you.   Follow-up at your convenience for a Pap smear.

## 2011-10-07 NOTE — Progress Notes (Signed)
  Subjective:    Patient ID: Kristen Davila, female    DOB: 02-11-1981, 31 y.o.   MRN: [id number]  HPI # Cold feeling in stomach Started 3 months ago when she started exercising It is there all the time when she breaths in She denies difficulty breathing, wheezing, coughing It is unchanged by eating She has not tried medications Alleviated by: nothing Exacerbated by: nothing  # Dermatology referral for dark spots on her face  Review of Systems Per HPI Denies chest pain, palpitations Denies lightheadedness Denies nausea/vomiting/constipation/diarrhea Denies dysuria Denies fevers/chills Denies stress, anxiety, depression  Denies skin changes Positive for intentional weight loss since exercising     Objective:   Physical Exam GEN: NAD PSYCH: engaged, not depressed or anxious appearing CV: RRR, no m/r/g PULM: NI WOB; CTAB without w/r/r ABD: soft, NT, ND, NABS; she indicates discomfort epigastric area, which is non-tender EXT: no edema    Assessment & Plan:

## 2011-10-07 NOTE — Assessment & Plan Note (Signed)
-  Will refer to dermatologist per patient request

## 2011-10-08 ENCOUNTER — Encounter: Payer: Self-pay | Admitting: Family Medicine

## 2012-01-29 ENCOUNTER — Encounter: Payer: Self-pay | Admitting: Family Medicine

## 2012-01-29 ENCOUNTER — Ambulatory Visit (INDEPENDENT_AMBULATORY_CARE_PROVIDER_SITE_OTHER): Payer: No Typology Code available for payment source | Admitting: Family Medicine

## 2012-01-29 VITALS — BP 90/52 | HR 76 | Temp 99.3°F | Ht 61.5 in | Wt 136.0 lb

## 2012-01-29 DIAGNOSIS — K029 Dental caries, unspecified: Secondary | ICD-10-CM | POA: Insufficient documentation

## 2012-01-29 NOTE — Progress Notes (Signed)
Interpreter [place] Namihira for Dr Sonnenberg 

## 2012-01-29 NOTE — Patient Instructions (Signed)
Nice to meet you today. I will put in a referral for a dentist.  It may take a few months for you to get an appointment.

## 2012-01-29 NOTE — Progress Notes (Signed)
  Subjective:    Patient ID: Kristen Davila, female    DOB: [birth year], 31 y.o.   MRN: [id number]  HPI Patient is a 31 yo female who presents requesting dental referral.  States she feels as though she has cavities. Denies any pain in mouth. Endorses bleeding gums. States she needs a cleaning of her teeth as she hasn't seen a dentist in 5 years. Also wants to have her teeth straightened out.    Review of Systems see HPI     Objective:   Physical Exam  Constitutional: She appears well-developed and well-nourished.  HENT:  Head: Normocephalic and atraumatic.  Mouth/Throat: Oropharynx is clear and moist.       Mouth: no areas of gingival erythema or swelling, no apparent bleeding from the gingiva, teeth normal in appearance other than incisors are crooked, no apparent loss of teeth   BP 90/52  Pulse 76  Temp 99.3 F (37.4 C) (Oral)  Ht 5' 1.5" (1.562 m)  Wt 136 lb (61.689 kg)  BMI 25.28 kg/m2     Assessment & Plan:

## 2012-01-29 NOTE — Assessment & Plan Note (Signed)
Patient with request for dental referral regarding perceived cavities and not seen dentist in 5 years. Plan: ambulatory referral put in for [facility].

## 2012-03-15 ENCOUNTER — Encounter: Payer: Self-pay | Admitting: Family Medicine

## 2012-03-15 ENCOUNTER — Ambulatory Visit (INDEPENDENT_AMBULATORY_CARE_PROVIDER_SITE_OTHER): Payer: Self-pay | Admitting: Family Medicine

## 2012-03-15 VITALS — BP 100/65 | HR 69 | Temp 98.4°F | Ht 61.5 in | Wt 136.0 lb

## 2012-03-15 DIAGNOSIS — R110 Nausea: Secondary | ICD-10-CM | POA: Insufficient documentation

## 2012-03-15 DIAGNOSIS — R109 Unspecified abdominal pain: Secondary | ICD-10-CM

## 2012-03-15 DIAGNOSIS — M545 Low back pain: Secondary | ICD-10-CM

## 2012-03-15 LAB — POCT URINALYSIS DIPSTICK
Bilirubin, UA: NEGATIVE
Blood, UA: NEGATIVE
Ketones, UA: NEGATIVE
Leukocytes, UA: NEGATIVE
Protein, UA: NEGATIVE
Spec Grav, UA: >=1.03
pH, UA: 6

## 2012-03-15 MED ORDER — DICLOFENAC SODIUM 75 MG PO TBEC: 75.0000 mg | DELAYED_RELEASE_TABLET | Freq: Two times a day (BID) | ORAL | Status: DC | PRN

## 2012-03-15 MED ORDER — ONDANSETRON HCL 4 MG PO TABS: 4.0000 mg | ORAL_TABLET | Freq: Three times a day (TID) | ORAL | Status: DC | PRN

## 2012-03-15 NOTE — Assessment & Plan Note (Signed)
And back pain.  UA normal, no sign of UTI or kidney stone.  Feel this may be from episodes of vomiting and diarrhea a few days ago.  Rx for zofran and NSAID to help with pain.  F/U if not improving.

## 2012-03-15 NOTE — Patient Instructions (Signed)
Your urinalysis is normal.  You do not have a urinary tract infection.  Please try the nausea medication and the pain medication to help you feel better.  I also think you need to call your Dermatologist about the new medicine they started if your nausea continues.  Please get plenty of rest and drink lots of fluids.

## 2012-03-15 NOTE — Assessment & Plan Note (Signed)
Rx for zofran, advised to call dermatologist about the medication, or to call our office when she gets home to let me know what the medication is.  She probably needs to discontinue this medication.

## 2012-03-15 NOTE — Progress Notes (Signed)
  Subjective:    Patient ID: Kristen Davila, female    DOB: Aug 02, 1980, 32 y.o.   MRN: [id number]  HPI  Kristen Davila comes in for back pain and abdominal pain that began this morning.  She says a few days ago she ate meat and then had 12-24 hours of vomiting and diarrhea.  She says no one else was sick.  Has felt warm but has not taken temperature.  No blood in vomit or stool.  Abdominal pain is generalized, so is back pain.  No incontinence or weakness, numbness in LE.  No dysuria, frequency.    She says that she has felt nauseated for about 2 weeks, since she started taking a medication the dermatologist prescribed.  She cannot remember the name of the medication.   Past Medical History  Diagnosis Date  . Varicose vein   . Headache   . Constipation    History  Substance Use Topics  . Smoking status: Never Smoker   . Smokeless tobacco: Not on file  . Alcohol Use: No   Review of Systems See HPI    Objective:   Physical Exam BP 100/65  Pulse 69  Temp 98.4 F (36.9 C) (Oral)  Ht 5' 1.5" (1.562 m)  Wt 136 lb (61.689 kg)  BMI 25.28 kg/m2  LMP 02/17/2012 General appearance: alert, cooperative and no distress Neck: no adenopathy and supple, symmetrical, trachea midline Lungs: clear to auscultation bilaterally Back: Normal curvature, no spinal or CVA tenderness to palpation.  Heart: regular rate and rhythm, S1, S2 normal, no murmur, click, rub or gallop Abdomen: soft, normal BS, patient has mild tenderness to palpation in lower quadrants, but no rebound or guarding Extremities: extremities normal, atraumatic, no cyanosis or edema Pulses: 2+ and symmetric       Assessment & Plan:

## 2012-03-16 ENCOUNTER — Encounter: Payer: Self-pay | Admitting: Family Medicine

## 2012-04-29 ENCOUNTER — Ambulatory Visit: Payer: No Typology Code available for payment source | Admitting: Family Medicine

## 2012-05-11 ENCOUNTER — Ambulatory Visit (INDEPENDENT_AMBULATORY_CARE_PROVIDER_SITE_OTHER): Payer: No Typology Code available for payment source | Admitting: Family Medicine

## 2012-05-11 VITALS — BP 94/58 | HR 70 | Temp 98.3°F | Wt 139.0 lb

## 2012-05-11 DIAGNOSIS — R1013 Epigastric pain: Secondary | ICD-10-CM

## 2012-05-11 MED ORDER — POLYETHYLENE GLYCOL 3350 17 GM/SCOOP PO POWD: 17.0000 g | Freq: Two times a day (BID) | ORAL | Status: DC | PRN

## 2012-05-11 NOTE — Patient Instructions (Signed)
Sndrome de colon irritable (Irritable Bowel Syndrome) El sndrome de colon irritable se origina en un trastorno de la funcin normal del intestino Tambin se lo denomina colon espstico, colitis mucosa y colon irritable. No es necesario realizar un tratamiento quirrgico, ni es un problema que pueda transformarse en cncer. No hay cura para este sndrome. Pero con Ardelia Mems dieta apropiada, reduccin del estrs y Conservation officer, nature, usted notar que sus problemas (sntomas) gradualmente desaparecern o mejorarn. Se trata de una enfermedad frecuente del aparato digestivo. Aparece con frecuencia a finales de la adolescencia o comienzos de la vida adulta., La proporcin de mujeres que sufren este trastorno es del doble que los hombres. CAUSAS Luego que el alimento es digerido y absorbido en el intestino delgado, Agricultural engineer de desecho se dirige hacia el colon (intestino grueso). En el colon se absorben el agua y las sales que provienen de los alimentos no digeridos que se encuentran en el intestino delgado. Los residuos que [place], o materia fecal, se retiene para su posterior eliminacin. Bajo circunstancias normales, ciertas contracciones suaves y rtmicas de las paredes del intestino empujan la materia fecal a lo largo del colon, hacia el recto. Sin embargo, cuando se presenta este problema, estas contracciones son irregulares y no coordinadas. Entonces ocurre que la materia fecal se retiene por un perodo prolongado, lo que origina constipacin, o se expele demasiado rpido, lo que produce diarrea. SNTOMAS El sntoma ms frecuente es Conservation officer, historic buildings. Lo ms frecuente es que el dolor se localice en la zona izquierda inferior del vientre (abdomen). Pero puede ocurrir en cualquier rea del abdomen. Puede sentirse como acidez de [place], Social research officer, government de espalda o Nurse, children's un dolor sordo en los brazos o en los hombros. El dolor se origina en espasmos excesivos de los msculos del intestino y de la acumulacin de gases y materia fecal en  el colon. Este dolor:  Puede oscilar entre clicos agudos en el vientre (abdomen) hasta un dolor sordo y continuo.  Generalmente empeora luego de comer.  Es caracterstico que se alivie al mover el intestino o [employer] gases. Generalmente se acompaa de constipacin. Pero tambin puede [employer]. La diarrea se produce inmediatamente despus de comer o al levantarse por la maana. Las heces son blandas y Research officer, trade union. Con frecuencia estn mezcladas con secreciones (mucus). Otros sntomas son:  Cabin crew  [employer]  Prdida del apetito  Ganas de vomitar (nuseas)  Vmitos Esta enfermedad tambin puede causar otros sntomas que no se relacionan con el sistema digestivo.  Fatiga.  Estados de ansiedad  Dificultades de Estate manager/land agent.  Dolor de [place].  Falta de [employer]. Estos sntomas tienden a Arts administrator y Armed forces operational officer. DIAGNSTICO Los sntomas se asemejan a los de otras enfermedades ms graves del aparato digestivo. Por lo tanto el profesional que lo asiste le indicar una serie adicional de anlisis para descartarlas. Debe estar seguro de hallar la causa (diagnostico) En este caso, se le explicar la naturaleza y el propsito de cada prueba. TRATAMIENTO Existe un buen nmero de medicamentos disponibles que ayudan a corregir el funcionamiento intestinal y/o a International aid/development worker intestinales y Conservation officer, historic buildings abdominal. Los medicamentos disponibles son:  Despina Arias, no irritantes para los casos de constipacin grave y para Air traffic controller a Marine scientist.  Medicamentos antidiarreicos especficos para tratar los casos graves o prolongados de [place].  Antiespasmdicos para Nutritional therapist.  El profesional que lo asiste tambin podr indicarle un tranquilizante o sedante suave durante los perodos extraordinarios de Psychologist, forensic  en su vida. Lo ms importante es recordar que si le prescriben un medicamento, deber  tomarlo exactamente como se lo han indicado. Hgale saber al profesional que lo asiste si ha obtenido alivio al tomarlo. INSTRUCCIONES PARA EL CUIDADO DOMICILIARIO  Evite los alimentos ricos en grasas o aceites. Por ejemplo, crema, manteca, salchichas, embutidos y otras comidas grasas.  Evite los alimentos que puedan tener efectos laxantes, como frutas, jugos de fruta y productos lcteos.  Elimine las bebidas gaseosas, la goma de [place] y los alimentos que producen gases como frijoles y col. Esto ayuda a Technical sales engineer hinchazn y los eructos.  Consuma salvado con gran cantidad de lquidos puede ayudar a combatir la constipacin.  Observe cules son los alimentos que originan sus sntomas.  Evite las situaciones de gran carga emocional o las circunstancias que producen ansiedad.  Comience o contine con un plan de ejercicios.  Descanse y duerma lo suficiente. EST SEGURO QUE:   Comprende las instrucciones para el alta mdica.  Controlar su enfermedad.  Solicitar atencin mdica de inmediato segn las indicaciones.

## 2012-05-11 NOTE — Progress Notes (Signed)
  Subjective:    Patient ID: Kristen Davila, female    DOB: May 14, 1980, 32 y.o.   MRN: [id number]  HPI # RUQ discomfort Persistent, intermittent, unchanged from previous. For the past 3-4 years.  She thinks it may be due to her gallbladder.  It is not associated with food. She denies nausea/vomiting.  Constipated. Bowel movement every 3-4 days. She denies diarrhea, jaundice.   She has lived in the [place] for 11 years. She was treated in [place] for parasites (no symptoms; most children there are treated).  Review of Systems Per HPI Denies blood in stool Denies fevers, chills Denies dysuria, frequency, urgency, abnormal vaginal discharge   Allergies, medication, past medical history reviewed.  Smoking status noted.  She has been losing weight intentionally     Objective:   Physical Exam GEN: NAD; well-nourished, -appearing ABD: soft, ND, mild suprapubic tenderness SKIN: no rash     Assessment & Plan:

## 2012-05-11 NOTE — Assessment & Plan Note (Signed)
Persistent abdominal discomfort. Now more RUQ.  D/Dx: gallbladder etiology possible but seems less likely; IBS; parasites; constipation -Check CMET  -For constipation: fluids, increase fiber in diet, Miralax prn -Follow-up in 1 week to re-evaluate, discuss labs

## 2012-05-12 ENCOUNTER — Other Ambulatory Visit: Payer: No Typology Code available for payment source

## 2012-05-12 DIAGNOSIS — R1013 Epigastric pain: Secondary | ICD-10-CM

## 2012-05-12 LAB — CBC WITH DIFFERENTIAL/PLATELET
Basophils Absolute: 0 10*3/uL (ref 0.0–0.1)
Basophils Relative: 0 % (ref 0–1)
Eosinophils Absolute: 0.1 10*3/uL (ref 0.0–0.7)
Eosinophils Relative: 1 % (ref 0–5)
HCT: 37 % (ref 36.0–46.0)
Hemoglobin: 12.2 g/dL (ref 12.0–15.0)
MCH: 25.8 pg — ABNORMAL LOW (ref 26.0–34.0)
MCHC: 33 g/dL (ref 30.0–36.0)
MCV: 78.4 fL (ref 78.0–100.0)
Monocytes Absolute: 0.3 10*3/uL (ref 0.1–1.0)
Monocytes Relative: 6 % (ref 3–12)
Neutro Abs: 3.1 10*3/uL (ref 1.7–7.7)
RDW: 13.5 % (ref 11.5–15.5)

## 2012-05-12 LAB — COMPREHENSIVE METABOLIC PANEL
AST: 18 U/L (ref 0–37)
Albumin: 4 g/dL (ref 3.5–5.2)
Alkaline Phosphatase: 45 U/L (ref 39–117)
BUN: 16 mg/dL (ref 6–23)
Creat: 0.64 mg/dL (ref 0.50–1.10)
Glucose, Bld: 90 mg/dL (ref 70–99)
Potassium: 3.6 mEq/L (ref 3.5–5.3)

## 2012-05-12 NOTE — Progress Notes (Signed)
CBC WITH DIFF AND CMP DONE TODAY Kristen Davila

## 2012-05-20 ENCOUNTER — Ambulatory Visit (INDEPENDENT_AMBULATORY_CARE_PROVIDER_SITE_OTHER): Payer: No Typology Code available for payment source | Admitting: Family Medicine

## 2012-05-20 ENCOUNTER — Encounter: Payer: Self-pay | Admitting: Family Medicine

## 2012-05-20 VITALS — BP 92/59 | HR 81 | Temp 98.7°F | Ht 61.5 in | Wt 141.0 lb

## 2012-05-20 DIAGNOSIS — R109 Unspecified abdominal pain: Secondary | ICD-10-CM

## 2012-05-20 LAB — POCT H PYLORI SCREEN: H Pylori Screen, POC: POSITIVE

## 2012-05-20 MED ORDER — CLARITHROMYCIN 500 MG PO TABS: 500.0000 mg | ORAL_TABLET | Freq: Two times a day (BID) | ORAL | Status: DC

## 2012-05-20 MED ORDER — AMOXICILLIN 500 MG PO CAPS: 1000.0000 mg | ORAL_CAPSULE | Freq: Two times a day (BID) | ORAL | Status: DC

## 2012-05-20 MED ORDER — OMEPRAZOLE 40 MG PO CPDR: 40.0000 mg | DELAYED_RELEASE_CAPSULE | Freq: Every day | ORAL | Status: DC

## 2012-05-20 NOTE — Patient Instructions (Signed)
Vamos a trator el gastritis con tres medicamentos: -omeprazole: 1 capsula diario antes de comer) -amoxicilina (antibiotico): 2 capsulas dos veces diario -clarithromycin: 1 tableta dos veces diario  [facility].  Regrese a Advertising copywriter 2-4 semanas.   Trate el medicamento contra el estrenimiento. Debe hacer pupu todos los dias.   Gastritis - Adultos  (Gastritis, Adult)  La gastrittis es la irritacin (inflamacin) de la membrana interna del estmago. Puede ser Neomia Dear enfermedad de inicio sbito (aguda) o de largo plazo (crnica). Si la gastritis no se trata, puede causar sangrado y lceras. CAUSAS  La gastritis se produce cuando la membrana que tapiza interiormente al estmago se debilita o se daa. Los jugos digestivos del estmago inflaman el revestimiento del estmago debilitado. El revestimiento del estmago puede debilitarse o daarse por una infeccin viral o bacteriana. La infeccin bacteriana ms comn es la infeccin por Helicobacter pylori. Tambin puede ser el resultado del consumo excesivo de alcohol, por el uso de ciertos medicamentos o porque hay demasiado cido en el estmago.  SNTOMAS  En algunos casos no hay sntomas. Si se presentan sntomas, stos pueden ser:   Dolor o sensacin de ardor en la parte superior del abdomen.  Nuseas.  Vmitos.  Sensacin molesta de distensin despus de comer. DIAGNSTICO  El mdico puede diagnosticar gastritis segn los sntomas y el examen fsico. Para determinar la causa de la gastritis, el mdico podr:   Pedir anlisis de sangre o de materia fecal para diagnosticar la presencia de la bacteria H pylori.  Gastroscopa Un tubo delgado y flexible (endoscopio) se pasa por Theatre stage manager al [employer]. El endoscopio tiene [place] luz y una cmara en el extremo. El mdico utilizar el endoscopio para observar el interior del [place].  Tomar una muestra de tejido (biopsia) del estmago para examinarlo en el  microscopio. TRATAMIENTO  Segn la causa de la gastritis podrn recetarle: Antibiticos, si la causa es una infeccin bacteriana, como una infeccin por H. pylori. Anticidos o bloqueadores H2, si hay demasiado cido en el estmago. El [employer] aconsejar que deje de tomar aspirina, ibuprofeno u otros antiinflamatorios no esteroides (AINE).  INSTRUCCIONES PARA EL CUIDADO EN EL HOGAR   Tome slo medicamentos de venta libre o recetados, segn las indicaciones del mdico.  Si le han recetado antibiticos, tmelos segn las indicaciones. Tmelos todos, aunque se sienta mejor.  Debe ingerir gran cantidad de lquido para mantener la orina de tono claro o color amarillo plido.  Evite las comidas y bebidas que [address] [place], [place]:  [place] con cafena o alcohlicas.  Chocolate.  Sabores a Advertising account planner.  Ajo y cebolla.  Comidas muy condimentadas.  Ctricos como naranjas, limones o limas.  Alimentos que contengan tomate, como salsas, [place] y pizza.  Alimentos fritos y Lexicographer.  Haga comidas pequeas durante Glass blower/designer de 3 comidas abundantes. SOLICITE ATENCIN MDICA DE INMEDIATO SI:   La materia fecal es negra o de color rojo oscuro.  Vomita sangre de color rojo brillante o material similar a granos de caf.  No puede retener los lquidos.  El dolor abdominal empeora.  Tiene fiebre.  No mejora luego de 1 semana.  Tiene preguntas o preocupaciones. ASEGRESE DE QUE:   Comprende estas instrucciones.  Controlar su enfermedad.  Solicitar ayuda de inmediato si no mejora o si empeora. Document Released: 11/19/2004 Document Revised: 08/11/2011 [facility] Patient Information 2013 [place], [place].

## 2012-05-20 NOTE — Progress Notes (Signed)
  Subjective:    Patient ID: Kristen Davila, female    DOB: [birth year], 32 y.o.   MRN: [id number]  HPI # Persistent abdominal pain, RUQ It comes and goes. Not associated with fasting, meals, or after meals. Nothing helps the pain. It is moderate in severity. It does not limit her from doing things but the persistence of it is irritating.  No diarrhea. Persistent constipation. She did not pick-up Rx Miralax.  She denies acid taste in mouth, burning sensation in chest.   Review of Systems Per HPI  Allergies, medication, past medical history reviewed.  Smoking status noted.     Objective:   Physical Exam GEN: NAD ABD: soft, ND, NABS, some mild diffuse abdominal tenderness without guarding  H. Pylori POC positive    Assessment & Plan:

## 2012-05-20 NOTE — Assessment & Plan Note (Signed)
Persistent. H. Pylori POC positive. Initiate triple therapy. Miralax for constipation. Follow-up in 2 weeks. We discussed normal CBC, CMET done at last visit.

## 2012-06-07 ENCOUNTER — Ambulatory Visit (INDEPENDENT_AMBULATORY_CARE_PROVIDER_SITE_OTHER): Payer: No Typology Code available for payment source | Admitting: Family Medicine

## 2012-06-07 ENCOUNTER — Encounter: Payer: Self-pay | Admitting: Family Medicine

## 2012-06-07 VITALS — BP 96/62 | HR 82 | Temp 98.2°F | Wt 141.0 lb

## 2012-06-07 DIAGNOSIS — R109 Unspecified abdominal pain: Secondary | ICD-10-CM

## 2012-06-07 DIAGNOSIS — L814 Other melanin hyperpigmentation: Secondary | ICD-10-CM

## 2012-06-07 DIAGNOSIS — L819 Disorder of pigmentation, unspecified: Secondary | ICD-10-CM

## 2012-06-07 DIAGNOSIS — Z0000 Encounter for general adult medical examination without abnormal findings: Secondary | ICD-10-CM

## 2012-06-07 MED ORDER — OMEPRAZOLE 40 MG PO CPDR: 40.0000 mg | DELAYED_RELEASE_CAPSULE | Freq: Every day | ORAL | Status: DC

## 2012-06-07 NOTE — Assessment & Plan Note (Signed)
Abdominal pain is improved following triple therapy for H. Pylori. Now she complains of flatulence that sounds like lactose sensitivity.  -Finish course of antibiotics for above -Avoid lactose; may try lactase if she wants to eat milk products -Avoid constipation. Increase Miralax from qd to bid

## 2012-06-07 NOTE — Progress Notes (Signed)
  Subjective:    Patient ID: Kristen Davila, female    DOB: 07/17/1980, 32 y.o.   MRN: [id number]  HPI # Abdominal pain is improved. Now she complains of gas that is worse after eating milk products.  She has bowel movements every 3-4 days, not hard, but she sits on toilet for about 15 minutes.   Review of Systems Denies nausea, vomiting, GERD symptoms  Allergies, medication, past medical history reviewed.  Smoking status noted.     Objective:   Physical Exam GEN: NAD; overweight ABD: soft, NT, ND, obese     Assessment & Plan:

## 2012-06-07 NOTE — Patient Instructions (Addendum)
Termine los medicamentos para gastritis Voy a imprimir otra receta para omeprazole hoy  Trate a aumentar a Miralax a dos veces diario. Puede tomar mas. El gol es tener pupu todos los dias y no se sienta en el inodoro mas de 5 minutos.   Regrese a Event organiser en 1 mes para el papanicolau.  Haga una cita antes de salir.

## 2012-07-04 ENCOUNTER — Other Ambulatory Visit (HOSPITAL_COMMUNITY)
Admission: RE | Admit: 2012-07-04 | Discharge: 2012-07-04 | Disposition: A | Discharge status: Home / Self Care | Payer: No Typology Code available for payment source | Source: Ambulatory Visit | Attending: Family Medicine | Admitting: Family Medicine

## 2012-07-04 ENCOUNTER — Encounter: Payer: Self-pay | Admitting: Family Medicine

## 2012-07-04 ENCOUNTER — Ambulatory Visit (INDEPENDENT_AMBULATORY_CARE_PROVIDER_SITE_OTHER): Payer: No Typology Code available for payment source | Admitting: Family Medicine

## 2012-07-04 VITALS — BP 97/64 | HR 87 | Ht 61.75 in | Wt 144.0 lb

## 2012-07-04 DIAGNOSIS — Z0000 Encounter for general adult medical examination without abnormal findings: Secondary | ICD-10-CM

## 2012-07-04 DIAGNOSIS — L814 Other melanin hyperpigmentation: Secondary | ICD-10-CM

## 2012-07-04 DIAGNOSIS — R21 Rash and other nonspecific skin eruption: Secondary | ICD-10-CM

## 2012-07-04 DIAGNOSIS — N898 Other specified noninflammatory disorders of vagina: Secondary | ICD-10-CM

## 2012-07-04 DIAGNOSIS — Z1151 Encounter for screening for human papillomavirus (HPV): Secondary | ICD-10-CM | POA: Insufficient documentation

## 2012-07-04 DIAGNOSIS — Z113 Encounter for screening for infections with a predominantly sexual mode of transmission: Secondary | ICD-10-CM | POA: Insufficient documentation

## 2012-07-04 DIAGNOSIS — L819 Disorder of pigmentation, unspecified: Secondary | ICD-10-CM

## 2012-07-04 DIAGNOSIS — Z01419 Encounter for gynecological examination (general) (routine) without abnormal findings: Secondary | ICD-10-CM | POA: Insufficient documentation

## 2012-07-04 DIAGNOSIS — Z124 Encounter for screening for malignant neoplasm of cervix: Secondary | ICD-10-CM

## 2012-07-04 MED ORDER — HYDROCORTISONE 2.5 % RE CREA: TOPICAL_CREAM | Freq: Two times a day (BID) | RECTAL | Status: DC

## 2012-07-04 NOTE — Patient Instructions (Addendum)
Vamos a Tour manager cita con dermatologo pero puede tomar [employer].  Si los [employer], voy a enviar una carta; si esta anomrales, voy a llamar a usted.

## 2012-07-04 NOTE — Assessment & Plan Note (Signed)
Referral to dermatology.  

## 2012-07-04 NOTE — Assessment & Plan Note (Signed)
Pap smear today.  GC/Chlamydia.  Implanon; she is due for removal August 2014; she is not sure she wants it replaced due to recent irregular periods past 3 months; she will consider and follow-up prior to August.

## 2012-07-04 NOTE — Progress Notes (Signed)
  Subjective:    Patient ID: Kristen Davila, female    DOB: 03/26/1980, 32 y.o.   MRN: [id number]  HPI # Physical exam She is sexually active with husband only but she is not sure of his fidelity She denies vaginal discharge, fevers, chills, irritation, nausea/vomiting  She has Implanon; August will make it 3 years  Review of Systems Per HPI  Denies difficulty breathing, chest pain  Rash on face--she had seen dermatologist in the past for this and received creams that helped; she wants another referral  Allergies, medication, past medical history reviewed.  Smoking status noted.     Objective:   Physical Exam GEN: NAD; well-nourished, -appearing PSYCH: not anxious or depressed appearing CV: RRR; no m/r/g PULM: NI WOB; CTAB without w/r/r ABD: soft, NT, ND EXT: no edema NEURO: moves all extremities well GU:    Vulva: normal   Vagina: normal no erythema; thin white discharge   Cervix: erythematous around os   Rectum: external hemorrhoids; mildly tender     Assessment & Plan:

## 2012-07-08 ENCOUNTER — Encounter: Payer: Self-pay | Admitting: Family Medicine

## 2012-10-31 ENCOUNTER — Ambulatory Visit: Payer: No Typology Code available for payment source

## 2012-11-07 ENCOUNTER — Ambulatory Visit: Payer: No Typology Code available for payment source | Admitting: Family Medicine

## 2012-12-07 ENCOUNTER — Encounter: Payer: Self-pay | Admitting: Family Medicine

## 2012-12-07 ENCOUNTER — Ambulatory Visit (INDEPENDENT_AMBULATORY_CARE_PROVIDER_SITE_OTHER): Payer: No Typology Code available for payment source | Admitting: Family Medicine

## 2012-12-07 VITALS — BP 108/72 | HR 73 | Ht 61.5 in | Wt 146.0 lb

## 2012-12-07 DIAGNOSIS — Z3046 Encounter for surveillance of implantable subdermal contraceptive: Secondary | ICD-10-CM | POA: Insufficient documentation

## 2012-12-07 MED ORDER — NORGESTIMATE-ETH ESTRADIOL 0.25-35 MG-MCG PO TABS: 1.0000 | ORAL_TABLET | Freq: Every day | ORAL | Status: DC

## 2012-12-07 NOTE — Assessment & Plan Note (Signed)
Patient wants nexplanon removed due to expiration 09/2012. - Discussed risks and benefits and patient filled out consent which has been scanned. - Removed nexplanon and patient tolerated procedure. - With [employer], patient does not get discount on depo shot which she wants. She does not want IUD due to discomfort with previous one. She wants another nexplanon but cannot afford it with no assistance. - Provided list of options and pt picked OCP. Rx printed for sprintec after discussing risk/benefits. Pt with no h/o tobacco abuse, migraines, or strokes. Described importance of taking at same time. - Patient plans to discuss with [facility] Dept if nexplanon offered more affordably there.

## 2012-12-07 NOTE — Patient Instructions (Signed)
Hemos quitado su implanon hoy.  Recomiendo usando anticonceptivo. Voy a imprimir receta para sprintec que es una pastilla que tiene que tomar diario. - [place] una alarma. - [place] condones para 2-3 semanas. - Si [place] otro anticonceptivo de [address] me dice.  Informacin sobre los [employer]  (Oral Contraception Information) Los anticonceptivos orales son medicamentos que se utilizan para Location manager. Su funcin es [employer] ovarios liberen vulos. Las hormonas de los anticonceptivos orales hacen que el moco cervical se haga ms espeso, lo que evita que el esperma ingrese al tero. Tambin hacen que la membrana que tapiza el tero se vuelva ms fina, lo que no permite que el huevo fertilizado se adhiera a la pared del tero. Los anticonceptivos orales son muy efectivos cuando se toman exactamente como se prescriben. Sin embargo, no previenen contra las enfermedades de transmisin sexual (ETS). La prctica del sexo seguro, como el uso de preservativos, junto con la [place], [place] a prevenir ese tipo de enfermedades. Antes de tomar la pldora, usted debe hacerse un examen fsico y un test de Pap. El mdico podr indicar anlisis de sangre si es necesario. El mdico se asegurar de que usted es una buena candidata para usar anticonceptivos orales. Converse con su mdico acerca de los posibles efectos secundarios de los anticonceptivos [place]. Cuando se inicia el uso de anticonceptivos [place], se pueden tomar durante 2 a 3 meses para que el cuerpo se adapte a los cambios en los niveles hormonales en el cuerpo.  HAY DOS TIPOS DE ANTICONCEPTIVOS ORALES  La pldora combinada. Esta pldora contiene las hormonas estrgeno y progestina (progesterona sinttica). La pldora combinada viene en envases para [address] o [address] En los envases para [address], usted no tomar las pldoras durante 7 [address] despus de la ltima pldora. En los envases para [address] la pldora se toma [employer]. Las ltimas 7 no contienen hormonas. Ciertos tipos de pldoras tienen ms de 21 pldoras que contienen hormonas.  La minipldora. Esta pldora contiene la hormona progesterona solamente. Es necesario tomarla todos los das de [place] continua. Viene en envases de 91 pldoras. Las primeras 84 contienen hormonas y las 7 ltimas no contienen. En los ltimos 7 das usted tendr su perodo menstrual. Puede experimentar manchado irregular. VENTAJAS   Disminuye los sntomas premenstruales.  Se [place] para tratar los [employer].  Regula el ciclo menstrual.  Disminuye el ciclo menstrual abundante.  Trata el acn.  Trata hemorragias uterinas anormales.  Trata el dolor plvico crnico.  Trata el sndrome ovrico poliqustico.  Trata la endometriosis.  Pueden usarse como anticonceptivos de Sport and exercise psychologist  Pueden ser menos efectivos si:   Olvid tomar la [employer] a la misma hora.  Usted tiene una enfermedad estomacal o intestinal que disminuye la absorcin de la pldora.  Toma anticonceptivos orales junto con otros frmacos que [employer] anticonceptivos sean menos eficaces.  Usted toma anticonceptivos orales que han vencido.  Cuando se [place] el envase de [place], se olvida de recomenzar el uso [employer] 7. Document Released: 11/19/2004 Document Revised: 05/04/2011 [facility] Patient Information 2014 [place], [place].

## 2012-12-07 NOTE — Progress Notes (Signed)
Patient ID: Kristen Davila, female   DOB: Jun 27, 1980, 32 y.o.   MRN: [id number] Subjective:   CC: Nexplanon removal  HPI:   1. Nexplanon removal - Patient presents for nexplanon removal due to expiration 09/2012. Denies complications with it other than more frequent / unpredictable bleeding for the last few months (possibly due to expiration in August).  Wants depo shot but only has [employer]. Had bad experience in the past with IUD so does not want this due to discomfort. Would like another nexplanon but cannot afford. Interested in knowing options - list provided. Wants ocp and to discuss with [facility] if she can get nexplanon. Does not report fevers/chills.  Review of Systems - Per HPI.   PMH: Previous IUD - does not want due to discomfort No H/o strokes, blood clots, or migraines.    Objective:  Physical Exam BP 108/72  Pulse 73  Ht 5' 1.5" (1.562 m)  Wt 146 lb (66.225 kg)  BMI 27.14 kg/m2 GEN: NAD, pleasant Left arm with nexplanon palpated just under the skin. No signs of infection.  PROCEDURE NOTE: NEXPLANON  REMOVAL Patient given informed consent and signed copy in the chart. Left arm area prepped and draped in the usual sterile fashion. Three cc of lidocaine without epinephrine 1% used for local anesthesia. A small stab incision was made close to the nexplanon with scalpel. Hemostats were used to withdraw the nexplanon. A small bandage was applied over a steri strip  No complications.Patient given follow up instructions should she experience redness, swelling at sight or fever in the next 24 hours. Patient was reminded this totally removes her nexplanon contraceptive device. (she can now potentially conceive)    Assessment:     Kristen Davila is a 32 y.o. female here for nexplanon removal.    Plan:     # See problem list for problem-specific plans.

## 2013-03-21 ENCOUNTER — Ambulatory Visit: Payer: No Typology Code available for payment source

## 2013-04-17 ENCOUNTER — Ambulatory Visit: Payer: No Typology Code available for payment source | Admitting: Family Medicine

## 2013-06-28 ENCOUNTER — Encounter: Payer: Self-pay | Admitting: Family Medicine

## 2013-06-28 ENCOUNTER — Ambulatory Visit (INDEPENDENT_AMBULATORY_CARE_PROVIDER_SITE_OTHER): Payer: No Typology Code available for payment source | Admitting: Family Medicine

## 2013-06-28 VITALS — BP 99/63 | HR 68 | Temp 98.1°F | Resp 18 | Wt 147.0 lb

## 2013-06-28 DIAGNOSIS — R1011 Right upper quadrant pain: Secondary | ICD-10-CM | POA: Insufficient documentation

## 2013-06-28 LAB — CBC
HCT: 39.7 % (ref 36.0–46.0)
Hemoglobin: 13.2 g/dL (ref 12.0–15.0)
MCH: 25.8 pg — AB (ref 26.0–34.0)
MCHC: 33.2 g/dL (ref 30.0–36.0)
MCV: 77.7 fL — AB (ref 78.0–100.0)
PLATELETS: 187 10*3/uL (ref 150–400)
RBC: 5.11 MIL/uL (ref 3.87–5.11)
RDW: 13.6 % (ref 11.5–15.5)
WBC: 5.5 10*3/uL (ref 4.0–10.5)

## 2013-06-28 LAB — POCT URINALYSIS DIPSTICK
Bilirubin, UA: NEGATIVE
Glucose, UA: NEGATIVE
Ketones, UA: NEGATIVE
NITRITE UA: NEGATIVE
PH UA: 6
PROTEIN UA: NEGATIVE
RBC UA: NEGATIVE
Spec Grav, UA: 1.025
UROBILINOGEN UA: 0.2

## 2013-06-28 NOTE — Addendum Note (Signed)
Addended by: Vale HavenBECK, Phylis Javed L on: 06/28/2013 03:27 PM   Modules accepted: Orders

## 2013-06-28 NOTE — Patient Instructions (Signed)
1) we will get some labs on you today and a urine test.  2) also, let us schedule an ultrasound to look at your gallbladder

## 2013-06-28 NOTE — Assessment & Plan Note (Signed)
Has had multiple times in the past. Neg US 02/2011 and labs in early 2014. Has had hpylori treated as well. Symptoms description and episode nature is most associated with something more like a biliary colic.  Currently not overtly jaundiced or febrile so unlikly to be true cholangitis and pain less severe than I would expect with a cholecystitis. Other diagnoses could be functional dyspepsia or IBS.  - will recheck labs and US - if negative, plan to discuss with PCP as to future management given recurrent nature of symptoms.

## 2013-06-28 NOTE — Progress Notes (Signed)
Patient ID: Kristen Davila, female   DOB: 04/08/1980, 33 y.o.   MRN: [id number] FAMILY MEDICINE OFFICE NOTE  Chief Complaint:  Abdominal pain  Primary Care Physician: Simone Curiahekkekandam, Maria, MD  HPI:  Kristen Davila is here for RUQ pain.   - has had this same pain for many years - usually happens every few months - last time w s 3 months ago - lasted for 5 days  This time pain started 2 days ago - pain all the time  - starts as a burning pain in the back of the right flank and then wraps around to her RUQ - at baseline it is a 7/10 and after eating 10/10 - worse after she eats - sometimes worse when she sleeps  - takes advil and it helps - becomes a 5/10  - no weight changes - no diarrhea, fevers, chills, nausea, vomiting.  - has constipation. Usually only goes every 3 days. However, is never hard or never has to strain.     PMHx:  Past Medical History  Diagnosis Date  . Varicose vein   . Headache(784.0)   . Constipation     No past surgical history on file.  FAMHx:  No family history on file.  SOCHx:   reports that she has never smoked. She does not have any smokeless tobacco history on file. She reports that she does not drink alcohol or use illicit drugs.  ALLERGIES:  No Known Allergies  ROS: Pertinent ROS as seen in HPI. Otherwise negative.   HOME MEDS: Current Outpatient Prescriptions  Medication Sig Dispense Refill  . amoxicillin (AMOXIL) 500 MG capsule Take 2 capsules (1,000 mg total) by mouth 2 (two) times daily.  60 capsule  0  . clarithromycin (BIAXIN) 500 MG tablet Take 1 tablet (500 mg total) by mouth 2 (two) times daily.  30 tablet  0  . hydrocortisone (ANUSOL-HC) 2.5 % rectal cream Place rectally 2 (two) times daily. Para hemorroides  30 g  0  . norgestimate-ethinyl estradiol (SPRINTEC 28) 0.25-35 MG-MCG tablet Take 1 tablet by mouth daily.  1 Package  11  . omeprazole (PRILOSEC) 40 MG capsule Take 1 capsule (40 mg total) by  mouth daily.  30 capsule  1  . polyethylene glycol powder (GLYCOLAX/MIRALAX) powder Take 17 g by mouth 2 (two) times daily as needed.  3350 g  1   No current facility-administered medications for this visit.    LABS/IMAGING: No results found for this or any previous visit (from the past 48 hour(s)). No results found.  VITALS: BP 99/63  Pulse 68  Temp(Src) 98.1 F (36.7 C) (Oral)  Resp 18  Wt 147 lb (66.679 kg)  SpO2 98%  LMP 06/02/2013  EXAM: Gen: NAD, well appearing HEENT: oropharynx clear, pupils equal and reactive PULM: LCTAB, no wheezes/rhonchi/rales CV: RRR, no murmurs ABD: soft, NT, ND, no hepatomegaly, some tenderness with palpation up on the RUQ rib cage. Mild and without rebound or guarding.  EXT: 2+ DP pulses, no edema    ASSESSMENT: RUQ abdominal pain - Plan: US Abdomen Limited RUQ, Comprehensive metabolic panel, POCT CBC  PLAN: See assessment and plan

## 2013-06-29 LAB — COMPREHENSIVE METABOLIC PANEL
ALK PHOS: 48 U/L (ref 39–117)
ALT: 24 U/L (ref 0–35)
AST: 29 U/L (ref 0–37)
Albumin: 4.2 g/dL (ref 3.5–5.2)
BUN: 13 mg/dL (ref 6–23)
CO2: 25 meq/L (ref 19–32)
Calcium: 9.1 mg/dL (ref 8.4–10.5)
Chloride: 104 mEq/L (ref 96–112)
Creat: 0.71 mg/dL (ref 0.50–1.10)
Glucose, Bld: 75 mg/dL (ref 70–99)
Potassium: 4 mEq/L (ref 3.5–5.3)
SODIUM: 138 meq/L (ref 135–145)
TOTAL PROTEIN: 6.7 g/dL (ref 6.0–8.3)
Total Bilirubin: 0.6 mg/dL (ref 0.2–1.2)

## 2013-06-30 ENCOUNTER — Ambulatory Visit (HOSPITAL_COMMUNITY)
Admission: RE | Admit: 2013-06-30 | Discharge: 2013-06-30 | Disposition: A | Discharge status: Home / Self Care | Payer: No Typology Code available for payment source | Source: Ambulatory Visit | Attending: Family Medicine | Admitting: Family Medicine

## 2013-06-30 DIAGNOSIS — R1011 Right upper quadrant pain: Secondary | ICD-10-CM

## 2013-06-30 DIAGNOSIS — K8020 Calculus of gallbladder without cholecystitis without obstruction: Secondary | ICD-10-CM | POA: Insufficient documentation

## 2013-06-30 DIAGNOSIS — R109 Unspecified abdominal pain: Secondary | ICD-10-CM | POA: Insufficient documentation

## 2013-07-04 ENCOUNTER — Telehealth: Payer: Self-pay | Admitting: *Deleted

## 2013-07-04 NOTE — Telephone Encounter (Signed)
Message copied by Aram BeechamBENTON, ASHA F on Tue Jul 04, 2013 10:04 AM ------      Message from: Vale HavenBECK, KELI L      Created: Fri Jun 30, 2013  6:46 PM       Hi ladies! I need your help.  Can we let this patient know via interpreter that her us showed that she does have some gallbladder sludge/stones and this is likely why she keeps having symptoms? Her labs were fine. She will likely need to just talk to a surgeon to see about surgery.  How do we go about doing that with the orange card? Byrd HesselbachMaria, since she is your patient, are you ok with all this???  Thanks all! ------

## 2013-07-04 NOTE — Telephone Encounter (Signed)
Marines do you mind calling this patient and explaining to her the orange card process for referral to surgery for possible gallbladder removal.

## 2013-07-04 NOTE — Telephone Encounter (Signed)
Lvm to pt hopefully pt will call us back.   Marines

## 2013-07-04 NOTE — Telephone Encounter (Signed)
Would like results of last weeks ultrasound

## 2013-07-06 ENCOUNTER — Other Ambulatory Visit: Payer: Self-pay | Admitting: Family Medicine

## 2013-07-06 DIAGNOSIS — K8050 Calculus of bile duct without cholangitis or cholecystitis without obstruction: Secondary | ICD-10-CM

## 2013-07-10 NOTE — Telephone Encounter (Signed)
Marines has contacted patient and informed her for results/referral process. Dr Reola CalkinsBeck to put in referral.

## 2013-08-23 ENCOUNTER — Ambulatory Visit (INDEPENDENT_AMBULATORY_CARE_PROVIDER_SITE_OTHER): Payer: No Typology Code available for payment source | Admitting: Family Medicine

## 2013-08-23 ENCOUNTER — Other Ambulatory Visit (HOSPITAL_COMMUNITY)
Admission: RE | Admit: 2013-08-23 | Discharge: 2013-08-23 | Disposition: A | Discharge status: Home / Self Care | Payer: No Typology Code available for payment source | Source: Ambulatory Visit | Attending: Family Medicine | Admitting: Family Medicine

## 2013-08-23 VITALS — BP 106/70 | HR 76 | Temp 98.3°F | Wt 146.0 lb

## 2013-08-23 DIAGNOSIS — R1011 Right upper quadrant pain: Secondary | ICD-10-CM

## 2013-08-23 DIAGNOSIS — O9989 Other specified diseases and conditions complicating pregnancy, childbirth and the puerperium: Secondary | ICD-10-CM

## 2013-08-23 DIAGNOSIS — Z3201 Encounter for pregnancy test, result positive: Secondary | ICD-10-CM

## 2013-08-23 DIAGNOSIS — Z124 Encounter for screening for malignant neoplasm of cervix: Secondary | ICD-10-CM

## 2013-08-23 DIAGNOSIS — Z3481 Encounter for supervision of other normal pregnancy, first trimester: Secondary | ICD-10-CM

## 2013-08-23 DIAGNOSIS — N912 Amenorrhea, unspecified: Secondary | ICD-10-CM

## 2013-08-23 DIAGNOSIS — N898 Other specified noninflammatory disorders of vagina: Secondary | ICD-10-CM

## 2013-08-23 DIAGNOSIS — Z01419 Encounter for gynecological examination (general) (routine) without abnormal findings: Secondary | ICD-10-CM | POA: Insufficient documentation

## 2013-08-23 DIAGNOSIS — Z113 Encounter for screening for infections with a predominantly sexual mode of transmission: Secondary | ICD-10-CM | POA: Insufficient documentation

## 2013-08-23 DIAGNOSIS — O26891 Other specified pregnancy related conditions, first trimester: Secondary | ICD-10-CM

## 2013-08-23 DIAGNOSIS — Z3480 Encounter for supervision of other normal pregnancy, unspecified trimester: Secondary | ICD-10-CM

## 2013-08-23 LAB — POCT URINALYSIS DIPSTICK
BILIRUBIN UA: NEGATIVE
Blood, UA: NEGATIVE
Glucose, UA: NEGATIVE
KETONES UA: NEGATIVE
Leukocytes, UA: NEGATIVE
Nitrite, UA: NEGATIVE
Protein, UA: NEGATIVE
Spec Grav, UA: >=1.03
Urobilinogen, UA: 0.2
pH, UA: 6

## 2013-08-23 LAB — POCT WET PREP (WET MOUNT): CLUE CELLS WET PREP WHIFF POC: NEGATIVE

## 2013-08-23 LAB — POCT URINE PREGNANCY: Preg Test, Ur: POSITIVE

## 2013-08-23 NOTE — Patient Instructions (Addendum)
[facility] [facility] [address] [place], [place] [address] [phone]  Su prueba del embarazo esta positiva. Llama [employer]opt[employer]Mom para hacer cita. Ellos deben decirnos cuando usted tiene Adopt-[employer]-Mom. Cuando nosotros recibe noticio del Adopt-[employer]-Mom, nuestra oficina haga citas para Ud. En nuestro visito, podemos evaluar si Ud debe ir a [facility] para visitos.  Haynes HoehnBuenos,  Leona SingletonMaria T Thekkekandam, MD

## 2013-08-23 NOTE — Progress Notes (Signed)
Patient ID: Kristen Davila, female   DOB: [birth year], 33 y.o.   MRN: [id number] Subjective:   CC: Positive home pregnancy test  HPI:   Interview conducted in Spanish  Kristen FritzBlanca is a 33 year old female here for a confirmatory pregnancy test. She reports a positive home pregnancy test around June 15. Since around that time, she has had nausea, fatigue, increased hunger, and nipple pain. She has also had some discomfort with hemorrhoids, mild dysuria occasionally, occasional chills, and about 2 months of malodorous discharge. She denies dyspareunia, vaginal bleeding, fevers, diarrhea or vomiting. She denies contractions or gush of clear fluid.  Review of Systems - Per HPI.   Social history: Patient is a nonsmoker    Objective:  Physical Exam BP 106/70  Pulse 76  Temp(Src) 98.3 F (36.8 C)  Wt 146 lb (66.225 kg) GEN: NAD Pulm: Normal effort Abdomen: Soft, nontender, nondistended Extremities: No lower extremity edema or calf tenderness Genitourinary: Normal vagina and cervix with mild amount of thin white discharge, no cervical motion tenderness, normal mobility of uterus and adnexa, uterus feels mildly full    Assessment:     Kristen Davila is a 33 y.o. female here for confirmatory pregnancy test.    Plan:     Positive home pregnancy test Patient is here for a confirmatory pregnancy test. Symptoms are consistent with pregnancy. She reports having had to have "injections" for previous pregnancy do to the preceding delivery prior to that one having been preterm at 7 months.  - Will need to evaluate this further at the initial prenatal. -Urine pregnancy test in clinic is positive -Patient is applying for adopt a mom. -They should let us know if she qualifies, and front office staff will set her up for OB labs and first prenatal appointment with me.  - Will inform patient she can begin prenatal vitamins. - [facility]asons to go to [facility] discussed.  Vaginal  discharge Afebrile with no cervical motion tenderness. Most likely physiologic. -Will check wet prep and GC/chlamydia -With some dysuria and chills, will also check UA with OB urine culture - Also collected pap smear as it has been  >1 year and pt is pregnant.  Gallstones Previous evaluation for abdominal pain with ultrasound positive for gallstones and biliary sludge. Patient [facility] but [facility] unfortunately. She is currently not having abdominal pain but still interested in this referral. -Checked on referral, and patient is still in the queue. If patient again become symptomatic, will attempt to push referral 35.     Leona SingletonMaria T Shawndell Schillaci, MD [facility]

## 2013-08-24 ENCOUNTER — Encounter: Payer: Self-pay | Admitting: Family Medicine

## 2013-08-24 DIAGNOSIS — N898 Other specified noninflammatory disorders of vagina: Secondary | ICD-10-CM | POA: Insufficient documentation

## 2013-08-24 DIAGNOSIS — O09213 Supervision of pregnancy with history of pre-term labor, third trimester: Secondary | ICD-10-CM

## 2013-08-24 DIAGNOSIS — O09893 Supervision of other high risk pregnancies, third trimester: Secondary | ICD-10-CM | POA: Insufficient documentation

## 2013-08-24 DIAGNOSIS — O26899 Other specified pregnancy related conditions, unspecified trimester: Secondary | ICD-10-CM

## 2013-08-24 NOTE — Assessment & Plan Note (Signed)
Previous evaluation for abdominal pain with ultrasound positive for gallstones and biliary sludge. Patient [facility] but [facility]. She is currently not having abdominal pain but still interested in this referral. -Checked on referral, and patient is still in the queue. If patient again become symptomatic, will attempt to push referral 35.

## 2013-08-24 NOTE — Assessment & Plan Note (Addendum)
Afebrile with no cervical motion tenderness. Most likely physiologic. -Will check wet prep and GC/chlamydia -With some dysuria and chills, will also check UA with OB urine culture - Also collected pap smear as it has been  >1 year and pt is pregnant.

## 2013-08-24 NOTE — Assessment & Plan Note (Signed)
Patient is here for a confirmatory pregnancy test. Symptoms are consistent with pregnancy. She reports having had to have "injections" for previous pregnancy do to the preceding delivery prior to that one having been preterm at 7 months.  - Will need to evaluate this further at the initial prenatal. -Urine pregnancy test in clinic is positive -Patient is applying for adopt a mom. -They should let us know if she qualifies, and front office staff will set her up for OB labs and first prenatal appointment with me.  - Will inform patient she can begin prenatal vitamins. - [facility] discussed.

## 2013-08-25 LAB — CULTURE, OB URINE
COLONY COUNT: NO GROWTH
Organism ID, Bacteria: NO GROWTH

## 2013-08-27 ENCOUNTER — Encounter: Payer: Self-pay | Admitting: Family Medicine

## 2013-08-28 ENCOUNTER — Encounter: Payer: Self-pay | Admitting: Family Medicine

## 2013-08-28 LAB — CYTOLOGY - PAP

## 2013-09-01 ENCOUNTER — Encounter: Payer: Self-pay | Admitting: Family Medicine

## 2013-09-18 ENCOUNTER — Ambulatory Visit: Payer: No Typology Code available for payment source

## 2013-09-27 ENCOUNTER — Other Ambulatory Visit: Payer: Self-pay

## 2013-09-27 DIAGNOSIS — Z331 Pregnant state, incidental: Secondary | ICD-10-CM

## 2013-09-27 NOTE — Progress Notes (Signed)
NEW OB LABS DONE TODAY Teagon Kron 

## 2013-09-28 LAB — SICKLE CELL SCREEN: SICKLE CELL SCREEN: NEGATIVE

## 2013-09-28 LAB — OBSTETRIC PANEL
ANTIBODY SCREEN: NEGATIVE
Basophils Absolute: 0 10*3/uL (ref 0.0–0.1)
Basophils Relative: 0 % (ref 0–1)
EOS ABS: 0.1 10*3/uL (ref 0.0–0.7)
EOS PCT: 1 % (ref 0–5)
HCT: 36.3 % (ref 36.0–46.0)
HEMOGLOBIN: 12.7 g/dL (ref 12.0–15.0)
Hepatitis B Surface Ag: NEGATIVE
Lymphocytes Relative: 18 % (ref 12–46)
Lymphs Abs: 1.2 10*3/uL (ref 0.7–4.0)
MCH: 26.8 pg (ref 26.0–34.0)
MCHC: 35 g/dL (ref 30.0–36.0)
MCV: 76.6 fL — AB (ref 78.0–100.0)
MONOS PCT: 5 % (ref 3–12)
Monocytes Absolute: 0.3 10*3/uL (ref 0.1–1.0)
Neutro Abs: 5.2 10*3/uL (ref 1.7–7.7)
Neutrophils Relative %: 76 % (ref 43–77)
Platelets: 161 10*3/uL (ref 150–400)
RBC: 4.74 MIL/uL (ref 3.87–5.11)
RDW: 14.1 % (ref 11.5–15.5)
RH TYPE: POSITIVE
Rubella: >33 Index — ABNORMAL HIGH (ref <0.90)
WBC: 6.8 10*3/uL (ref 4.0–10.5)

## 2013-09-28 LAB — CULTURE, OB URINE

## 2013-09-28 LAB — HIV ANTIBODY (ROUTINE TESTING W REFLEX): HIV: NONREACTIVE

## 2013-10-04 ENCOUNTER — Encounter: Payer: Self-pay | Admitting: Family Medicine

## 2013-10-04 ENCOUNTER — Ambulatory Visit (INDEPENDENT_AMBULATORY_CARE_PROVIDER_SITE_OTHER): Payer: No Typology Code available for payment source | Admitting: Family Medicine

## 2013-10-04 ENCOUNTER — Other Ambulatory Visit (HOSPITAL_COMMUNITY)
Admission: RE | Admit: 2013-10-04 | Discharge: 2013-10-04 | Disposition: A | Discharge status: Home / Self Care | Payer: No Typology Code available for payment source | Source: Ambulatory Visit | Attending: Family Medicine | Admitting: Family Medicine

## 2013-10-04 VITALS — BP 100/66 | HR 86 | Wt 152.0 lb

## 2013-10-04 DIAGNOSIS — O9989 Other specified diseases and conditions complicating pregnancy, childbirth and the puerperium: Secondary | ICD-10-CM

## 2013-10-04 DIAGNOSIS — Z3201 Encounter for pregnancy test, result positive: Secondary | ICD-10-CM

## 2013-10-04 DIAGNOSIS — Z113 Encounter for screening for infections with a predominantly sexual mode of transmission: Secondary | ICD-10-CM | POA: Insufficient documentation

## 2013-10-04 DIAGNOSIS — O26891 Other specified pregnancy related conditions, first trimester: Principal | ICD-10-CM

## 2013-10-04 DIAGNOSIS — R220 Localized swelling, mass and lump, head: Secondary | ICD-10-CM

## 2013-10-04 DIAGNOSIS — N898 Other specified noninflammatory disorders of vagina: Secondary | ICD-10-CM

## 2013-10-04 DIAGNOSIS — R221 Localized swelling, mass and lump, neck: Secondary | ICD-10-CM

## 2013-10-04 LAB — POCT WET PREP (WET MOUNT): Clue Cells Wet Prep Whiff POC: NEGATIVE

## 2013-10-04 LAB — T3, FREE: T3, Free: 2.4 pg/mL (ref 2.3–4.2)

## 2013-10-04 LAB — T4, FREE: Free T4: 0.93 ng/dL (ref 0.80–1.80)

## 2013-10-04 LAB — TSH: TSH: 2.014 u[IU]/mL (ref 0.350–4.500)

## 2013-10-04 MED ORDER — PRENATAL VITAMINS 28-0.8 MG PO TABS: 1.0000 | ORAL_TABLET | Freq: Every day | ORAL | Status: DC

## 2013-10-04 NOTE — Progress Notes (Signed)
Kristen Davila is a 33 y.o. yo [phone] at 4010w0d who presents for her initial prenatal visit. Pregnancy  is planned She reports abdominal pain with bloating daily and all night, pain sometimes reaching 7-8/10, some nausea. She denies diarrhea but reports some constipation. She denies dysuria and does not report fevers/chills. She has not tried medication for this.  She also complains of vaginal discharge with odor but no pain or itching. She  is notTaking PNV but is taking folic acid.  She denies contractions or vaginal bleeding. She has not started feeling fetal movement. See flow sheet for details.  PMH, POBH, FH, meds, allergies and Social Hx reviewed with patient. - OB hx: She reports that with her 34 year old daughter, she had "leaking" of fluid when she was at around "6 months" gestation and had to have bedrest for ~1 month. With her next pregnancy, she received some type of injection (likely 17P). - SH: Works at [employer]. Is not exposed to much smoke.  Prenatal exam:Gen: Well nourished, well developed.  No distress.  Vitals noted. HEENT: Normocephalic, atraumatic.  Neck supple without cervical lymphadenopathy. ?palpable thyroid nodule vs underlying trachea. fair dentition. CV: RRR no murmur, gallops or rubs Lungs: CTA B.  Normal respiratory effort without wheezes or rales. Abd: soft, NTND. +BS.  Uterus not appreciated above pelvis. GU: Normal external female genitalia without lesions.  Nl vaginal, well rugated without lesions. Thin white vaginal discharge.  Bimanual exam: No adnexal mass or TTP. No CMT.  Uterus size ~15 cm. Ext: No clubbing, cyanosis or edema. Psych: Normal grooming and dress.  Not depressed or anxious appearing.  Normal thought content and process without flight of ideas or looseness of associations  Assessment/plan: 1) Pregnancy G4P2103 at 2730w2d doing well.  Current pregnancy issues include vaginal discharge and h/o needing 17p with prior pregnancy due to  preceding pregnancy delivered reportedly at 32 weeks (unable to find notes in chart documenting this due to not being on EMR fully at that time). - Wet prep and GC/chlamydia today. - referral to [facility] placed. Dating is reliable per mom within 4 days. Prenatal labs reviewed. Nl CBC, neg HIV, RPR, urine, and Hep B. Bleeding and pain precautions reviewed. Importance of prenatal vitamins and avoiding smoke exposure reviewed. - Ordered prenatal vitamin, asked her to take this alone and to bring both to visit for review. Genetic screening offered and declined. Early glucola is indicated due to race and prior preterm delivery - will defer to HROB visit. PMH form completed with concerns identified: prior preterm birth (32 weeks per mom), non English speaking). PHQ-9 score 6. Mild depression with no SI/HI. [facility]. Abdominal pain - Likely gas with bloating sensation.  Palpable ?thyroid nodule - normal TSH. Reflex free T3 and free T4 were done and normal. - Recommended tums and f/u if no improvement. Uncertain varicella status - Varicella IgG high therefore h/o either disease or immunization. H/o gallstones - not currently painful. - Avoid fatty foods. Recommended flu shot in October.  Leona SingletonMaria T Idaliz Tinkle, MD

## 2013-10-04 NOTE — Patient Instructions (Signed)
Buenos.  Vamos a Radio producerhacer referencia a obstetrica porque necesita este injeccion para otro embarazo dando la luz prematura.  Estamos collectando laboratorios. Llamamos a ud si algunos estan anormales.  Toma vitaminas prenatales en vez que vitamina acido folico.  Llamanos en 1 semana si no [facility]a [facility].  Puede tomar tums para gas.  En octubre, recomiendo [employer] contra flu.  Haynes HoehnBuenos,  Kristen SingletonMaria T Tiffine Henigan, MD

## 2013-10-05 LAB — VARICELLA ZOSTER ANTIBODY, IGG: Varicella IgG: 2179 Index — ABNORMAL HIGH (ref <135.00)

## 2013-10-06 ENCOUNTER — Encounter: Payer: Self-pay | Admitting: Family Medicine

## 2013-10-06 NOTE — Assessment & Plan Note (Signed)
See flow sheet

## 2013-10-27 ENCOUNTER — Ambulatory Visit (INDEPENDENT_AMBULATORY_CARE_PROVIDER_SITE_OTHER): Payer: No Typology Code available for payment source | Admitting: Family Medicine

## 2013-10-27 VITALS — BP 91/50 | HR 63 | Temp 98.2°F | Wt 153.9 lb

## 2013-10-27 DIAGNOSIS — Z3480 Encounter for supervision of other normal pregnancy, unspecified trimester: Secondary | ICD-10-CM

## 2013-10-27 DIAGNOSIS — Z711 Person with feared health complaint in whom no diagnosis is made: Secondary | ICD-10-CM

## 2013-10-27 DIAGNOSIS — N898 Other specified noninflammatory disorders of vagina: Secondary | ICD-10-CM

## 2013-10-27 DIAGNOSIS — Z3492 Encounter for supervision of normal pregnancy, unspecified, second trimester: Secondary | ICD-10-CM

## 2013-10-27 DIAGNOSIS — O09892 Supervision of other high risk pregnancies, second trimester: Secondary | ICD-10-CM

## 2013-10-27 DIAGNOSIS — Z0000 Encounter for general adult medical examination without abnormal findings: Secondary | ICD-10-CM

## 2013-10-27 DIAGNOSIS — O09219 Supervision of pregnancy with history of pre-term labor, unspecified trimester: Secondary | ICD-10-CM

## 2013-10-27 DIAGNOSIS — O09212 Supervision of pregnancy with history of pre-term labor, second trimester: Secondary | ICD-10-CM

## 2013-10-27 LAB — POCT WET PREP (WET MOUNT): CLUE CELLS WET PREP WHIFF POC: NEGATIVE

## 2013-10-27 NOTE — Progress Notes (Signed)
Patient ID: Kristen Davila, female   DOB: [birth year], 33 y.o.   MRN: [id number]  Spanish interpreter utilized during this visit.  HPI:  Kristen Davila is a 33 y.o. [phone] at [redacted]w[redacted]d who presents for a same day appointment to discuss concerns with her pregnancy.  She is concerned that she has not yet felt the baby move at all. She also is worried that her water is broken and she has fluid leaking. She wears a pad and states that it is always wet. She has had this same type of fluid leaking for 4 months, for the entire duration of her pregnancy. She thinks she has both vaginal discharge and a separate type of fluid leaking. The discharge/fluid has been the exact same throughout her pregnancy and has not changed. She denies any contractions or vaginal bleeding. No vomiting but has some nausea. Otherwise is feeling well.   She has hx of preterm delivery and is being referred to high risk OB clinic for 17-P injections. She has not yet gotten a call about setting that appointment up.  She also requests a dental referral for the orange card for routine dental care. She understands that the waiting list is many months long and would like to be placed on it.  ROS: See HPI  PMFSH: hx of preterm delivery, reports water broke at 6 months gestation during that pregnancy  PHYSICAL EXAM: BP 91/50  Pulse 63  Temp(Src) 98.2 F (36.8 C) (Oral)  Wt 153 lb 14.4 oz (69.809 kg)  LMP 06/28/2013 Gen: NAD HEENT: NCAT Abdomen: soft, nontender to palpation. Fundal height 16cm. Neuro: grossly nonfocal speech normal GU: normal appearing external genitalia without lesions. Vagina is moist with white discharge. NO POOLING. Cervix normal in appearance. No liquid seen coming out of os with valsalva maneuver. No cervical motion tenderness or tenderness on bimanual exam. No adnexal masses. Cervix closed and thick.  ASSESSMENT/PLAN:  Kristen Davila is a 33 y.o. [phone] at [redacted]w[redacted]d presenting  with a couple concerns.  1. Fetal movement - FHR detected on doppler today at 134 bpm. Reassured pt that she may not feel baby move for another few weeks, and that feeling movement is not expected at this gestational age.   2. Fluid leaking - no pooling on speculum exam today, or fluid coming from os with valsalva. Will check prep but suspect this is physiologic leukorrhea of pregnancy, especially given that it has been constant throughout the duration of her pregnancy. Rupture of membranes very unlikely. Had recent negative gc/chlamydia so will not repeat today. Reassured patient that it does NOT seem her membranes have ruptured.  3. Dental referral - will enter, pt understands that it may be months before she sees dentist  4. Hx of preterm delivery - will enter referral to [facility] since this does not seem to have been set up yet.  FOLLOW UP: F/u with high risk OB to establish prenatal care and get 17-P injections.  Kristen J. Pollie Meyer, MD [facility]

## 2013-10-27 NOTE — Patient Instructions (Signed)
Segundo trimestre de embarazo (Second Trimester of Pregnancy) El segundo trimestre va desde la semana13 hasta la 28, desde el cuarto hasta el sexto mes, y suele ser el momento en el que mejor se siente. Su organismo se ha adaptado a estar embarazada y comienza a sentirse fsicamente mejor. En general, las nuseas matutinas han disminuido o han desaparecido completamente, p El segundo trimestre es tambin la poca en la que el feto se desarrolla rpidamente. Hacia el final del sexto mes, el feto mide aproximadamente 9pulgadas (23cm) y pesa alrededor de 1 libras (700g). Es probable que sienta que el beb se mueve (da pataditas) entre las 18 y 20semanas del embarazo. CAMBIOS EN EL ORGANISMO Su organismo atraviesa por muchos cambios durante el embarazo, y estos varan de una mujer a otra.   Seguir aumentando de peso. Notar que la parte baja del abdomen sobresale.  Podrn aparecer las primeras estras en las caderas, el abdomen y las mamas.  Es posible que tenga dolores de cabeza que pueden aliviarse con los medicamentos que su mdico autorice.  Tal vez tenga necesidad de orinar con ms frecuencia porque el feto est ejerciendo presin sobre la vejiga.  Debido al embarazo podr sentir acidez estomacal con frecuencia.  Puede estar estreida, ya que ciertas hormonas enlentecen los movimientos de los msculos que empujan los desechos a travs de los intestinos.  Pueden aparecer hemorroides o abultarse e hincharse las venas (venas varicosas).  Puede tener dolor de espalda que se debe al aumento de peso y a que las hormonas del embarazo relajan las articulaciones entre los huesos de la pelvis, y como consecuencia de la modificacin del peso y los msculos que mantienen el equilibrio.  Las mamas seguirn creciendo y le dolern.  Las encas pueden sangrar y estar sensibles al cepillado y al hilo dental.  Pueden aparecer zonas oscuras o manchas (cloasma, mscara del embarazo) en el rostro que  probablemente se atenuarn despus del nacimiento del beb.  Es posible que se forme una lnea oscura desde el ombligo hasta la zona del pubis (linea nigra) que probablemente se atenuarn despus del nacimiento del beb.  Tal vez haya cambios en el cabello que pueden incluir su engrosamiento, crecimiento rpido y cambios en la textura. Adems, a algunas mujeres se les cae el cabello durante o despus del embarazo, o tienen el cabello seco o fino. Lo ms probable es que el cabello se le normalice despus del nacimiento del beb. QU DEBE ESPERAR EN LAS CONSULTAS PRENATALES Durante una visita prenatal de rutina:  La pesarn para asegurarse de que usted y el feto estn creciendo normalmente.  Le tomarn la presin arterial.  Le medirn el abdomen para controlar el desarrollo del beb.  Se escucharn los latidos cardacos fetales.  Se evaluarn los resultados de los estudios solicitados en visitas anteriores. El mdico puede preguntarle lo siguiente:  Cmo se siente.  Si siente los movimientos del beb.  Si ha tenido sntomas anormales, como prdida de lquido, sangrado, dolores de cabeza intensos o clicos abdominales.  Si tiene alguna pregunta. Otros estudios que podrn realizarse durante el segundo trimestre incluyen lo siguiente:  Anlisis de sangre para detectar:  Concentraciones de hierro bajas (anemia).  Diabetes gestacional (entre la semana 24 y la 28).  Anticuerpos Rh.  Anlisis de orina para detectar infecciones, diabetes o protenas en la orina.  Una ecografa para confirmar que el beb crece y se desarrolla correctamente.  Una amniocentesis para diagnosticar posibles problemas genticos.  Estudios del feto para descartar espina   bfida y sndrome de Down. INSTRUCCIONES PARA EL CUIDADO EN EL HOGAR   Evite fumar, consumir hierbas, beber alcohol y tomar frmacos que no le hayan recetado. Estas sustancias qumicas afectan la formacin y el desarrollo del beb.  Siga  las indicaciones del mdico en relacin con el uso de medicamentos. Durante el embarazo, hay medicamentos que son seguros de tomar y otros que no.  Haga actividad fsica solo en la forma indicada por el mdico. Sentir clicos uterinos es un buen signo para detener la actividad fsica.  Contine comiendo alimentos que sanos con regularidad.  Use un sostn que le brinde buen soporte si le duelen las mamas.  No se d baos de inmersin en agua caliente, baos turcos ni saunas.  Colquese el cinturn de seguridad cuando conduzca.  No coma carne cruda ni queso sin cocinar; evite el contacto con las bandejas sanitarias de los gatos y la tierra que estos animales usan. Estos elementos contienen grmenes que pueden causar defectos congnitos en el beb.  Tome las vitaminas prenatales.  Si est estreida, pruebe un laxante suave (si el mdico lo autoriza). Consuma ms alimentos ricos en fibra, como vegetales y frutas frescos y cereales integrales. Beba gran cantidad de lquido para mantener la orina de tono claro o color amarillo plido.  Dese baos de asiento con agua tibia para aliviar el dolor o las molestias causadas por las hemorroides. Use una crema para las hemorroides si el mdico la autoriza.  Si tiene venas varicosas, use medias de descanso. Eleve los pies durante 15minutos, 3 o 4veces por da. Limite la cantidad de sal en su dieta.  No levante objetos pesados, use zapatos de tacones bajos y mantenga una buena postura.  Descanse con las piernas elevadas si tiene calambres o dolor de cintura.  Visite a su dentista si an no lo ha hecho durante el embarazo. Use un cepillo de dientes blando para higienizarse los dientes y psese el hilo dental con suavidad.  Puede seguir manteniendo relaciones sexuales, a menos que el mdico le indique lo contrario.  Concurra a todas las visitas prenatales segn las indicaciones de su mdico. SOLICITE ATENCIN MDICA SI:   Tiene mareos.  Siente  clicos leves, presin en la pelvis o dolor persistente en el abdomen.  Tiene nuseas, vmitos o diarrea persistentes.  Tiene secrecin vaginal con mal olor.  Siente dolor al orinar. SOLICITE ATENCIN MDICA DE INMEDIATO SI:   Tiene fiebre.  Tiene una prdida de lquido por la vagina.  Tiene sangrado o pequeas prdidas vaginales.  Siente dolor intenso o clicos en el abdomen.  Sube o baja de peso rpidamente.  Tiene dificultad para respirar y siente dolor de pecho.  Sbitamente se le hinchan mucho el rostro, las manos, los tobillos, los pies o las piernas.  No ha sentido los movimientos del beb durante una hora.  Siente un dolor de cabeza intenso que no se alivia con medicamentos.  Hay cambios en la visin. Document Released: 11/19/2004 Document Revised: 02/14/2013 Exit[employer] Patient Information 2015 [employer]. This information is not intended to replace advice given to you by your health care provider. Make sure you discuss any questions you have with your health care provider.  

## 2013-11-16 ENCOUNTER — Ambulatory Visit (INDEPENDENT_AMBULATORY_CARE_PROVIDER_SITE_OTHER): Payer: No Typology Code available for payment source | Admitting: Family Medicine

## 2013-11-16 ENCOUNTER — Encounter: Payer: Self-pay | Admitting: Family Medicine

## 2013-11-16 VITALS — BP 101/61 | HR 80 | Temp 98.0°F | Wt 154.6 lb

## 2013-11-16 DIAGNOSIS — O09212 Supervision of pregnancy with history of pre-term labor, second trimester: Secondary | ICD-10-CM

## 2013-11-16 DIAGNOSIS — O09892 Supervision of other high risk pregnancies, second trimester: Secondary | ICD-10-CM

## 2013-11-16 DIAGNOSIS — Z23 Encounter for immunization: Secondary | ICD-10-CM

## 2013-11-16 DIAGNOSIS — Z3201 Encounter for pregnancy test, result positive: Secondary | ICD-10-CM

## 2013-11-16 DIAGNOSIS — O09219 Supervision of pregnancy with history of pre-term labor, unspecified trimester: Secondary | ICD-10-CM

## 2013-11-16 LAB — POCT URINALYSIS DIP (DEVICE)
Bilirubin Urine: NEGATIVE
Glucose, UA: NEGATIVE mg/dL
Hgb urine dipstick: NEGATIVE
Ketones, ur: NEGATIVE mg/dL
NITRITE: NEGATIVE
Protein, ur: NEGATIVE mg/dL
Specific Gravity, Urine: 1.015 (ref 1.005–1.030)
UROBILINOGEN UA: 0.2 mg/dL (ref 0.0–1.0)
pH: 7.5 (ref 5.0–8.0)

## 2013-11-16 NOTE — Progress Notes (Signed)
Anatomy/Growth U/S with Radiology 11/23/13 @ 8a, spanish interpreter Alvi present.

## 2013-11-16 NOTE — Progress Notes (Signed)
   Subjective:    Kristen Davila is a [id number] [redacted]w[redacted]d being seen today for her first obstetrical visit.  Her obstetrical history is significant for preterm delivery.  She had 17-P injections with her last birth.. Patient does intend to breast feed. Pregnancy history fully reviewed.  Patient reports no bleeding, no contractions, no cramping and no leaking.  Filed Vitals:   11/16/13 0917  BP: 101/61  Pulse: 80  Temp: 98 F (36.7 C)  Weight: 154 lb 9.6 oz (70.126 kg)    HISTORY: OB History  Gravida Para Term Preterm AB SAB TAB Ectopic Multiple Living  # Outcome Date GA Lbr Len/2nd Weight Sex Delivery Anes PTL Lv  4 CUR           3 TRM 09/14/09 [redacted]w[redacted]d  7 lb (3.175 kg) M SVD  Y      Comments: 17P injections  2 PRE 10/12/02 [redacted]w[redacted]d  4 lb (1.814 kg) F SVD  Y      Comments: ? PPROM or oligohydramnios, bedrest for about 1 month  1 TRM 05/11/98 [redacted]w[redacted]d  7 lb (3.175 kg) F SVD   Y     Past Medical History  Diagnosis Date  . Varicose vein   . Headache(784.0)   . Constipation    History reviewed. No pertinent past surgical history. History reviewed. No pertinent family history.   Exam    Uterus:     Pelvic Exam: Previously done at [facility]      System:     Skin: normal coloration and turgor, no rashes    Neurologic: oriented, normal, gait normal; reflexes normal and symmetric   Extremities: normal strength, tone, and muscle mass   HEENT PERRLA and extra ocular movement intact   Mouth/Teeth mucous membranes moist, pharynx normal without lesions   Neck supple and no masses   Cardiovascular: regular rate and rhythm, no murmurs or gallops   Respiratory:  appears well, vitals normal, no respiratory distress, acyanotic, normal RR, ear and throat exam is normal, neck free of mass or lymphadenopathy, chest clear, no wheezing, crepitations, rhonchi, normal symmetric air entry   Abdomen: soft, non-tender; bowel sounds normal; no masses,  no organomegaly          Assessment:    Pregnancy: [id number] Patient Active Problem List   Diagnosis Date Noted  . History of preterm delivery, currently pregnant in second trimester 08/24/2013  . Vaginal discharge during pregnancy 08/24/2013  . RUQ abdominal pain 06/28/2013  . Solar lentigo 05/29/2011  . Acne 05/29/2011  . Well woman exam (no gynecological exam) 03/03/2011        Plan:     Initial labs drawn at [facility] - reviewed. Prenatal vitamins. Problem list reviewed and updated. Genetic Screening discussed Quad Screen: declined.  Ultrasound discussed; fetal survey: requested.  Follow up in 4 weeks. Will fill out [facility] paperwork and start injections. 100% of 45 min visit spent on counseling and coordination of care.     Candelaria Celeste JEHIEL 11/16/2013

## 2013-11-16 NOTE — Progress Notes (Signed)
C/o of intermittent abdominal pain-- reports goes away when she lies down.  C/o of vaginal discharge with odor.  New OB packet given.  Flu vaccine today.

## 2013-11-16 NOTE — Patient Instructions (Signed)
Segundo trimestre del embarazo  (Second Trimester of Pregnancy)  El segundo trimestre del embarazo se extiende desde la semana 13 hasta la semana 28, del 4 al 6 mes. En general, es el momento del embarazo en el que se sentir mejor. En general, las nuseas matutinas disminuyen o desaparecen. Tendr ms energa y podr aumentarle el apetito. El beb por nacer (feto) se desarrolla rpidamente. Hacia el final del sexto mes, el beb mide aproximadamente 9 pulgadas (23 cm) y pesa alrededor de 1 libras (700 g). Es probable que sienta mover al beb (dar pataditas) entre las 18 y 20 semanas del embarazo. CUIDADOS EN EL HOGAR   Evite fumar, consumir hierbas y beber alcohol. Evite los frmacos que no apruebe el mdico.  Slo tome los medicamentos que le haya indicado su mdico. Algunos medicamentos son seguros para tomar durante el embarazo y otros no lo son.  Haga ejercicios slo como le indique el mdico. Deje de hacer ejercicios si comienza a tener clicos.  Haga comidas regulares y sanas.  Use un sostn que le brinde buen soporte si sus mamas estn sensibles.  No utilice la baera con agua caliente, baos turcos y saunas.  Colquese el cinturn de seguridad cuando conduzca.  Evite comer carne cruda y el contacto con los utensilios y desperdicios de los gatos.  Tome las vitaminas indicadas para la etapa prenatal.  Trate de tomar medicamentos para mover el intestino (laxantes) segn lo necesario y si su mdico la autoriza. Consuma ms fibra comiendo frutas y vegetales frescos y granos enteros. Beba gran cantidad de lquido para mantener el pis (orina) de tono claro o amarillo plido.  Tome baos de agua tibia (baos de asiento) para calmar el dolor o las molestias causadas por las hemorroides. Use una crema para las hemorroides si el mdico la autoriza.  Si tiene venas hinchadas y abultadas (venas varicosas), use medias de soporte. Eleve (levante) los pies durante 15 minutos, 3 o 4 veces por  da. Limite el consumo de sal en su dieta.  Evite levantar objetos pesados, usar tacones altos y sintese derecha.  Descanse con las piernas elevadas si tiene calambres o dolor de cintura.  Visite a su dentista si no lo ha hecho durante el embarazo. Use un cepillo de dientes blando para higienizarse los dientes. Use suavemente el hilo dental.  Puede tener sexo (relaciones sexuales) siempre que el mdico la autorice.  Concurra a los controles mdicos. SOLICITE AYUDA SI:   Siente mareos.  Siente clicos intensos en el estmago, en la espalda o en el vientre (abdomen).  Siente un dolor persistente en la zona del vientre.  Tiene malestar estomacal (nuseas), devuelve (vomita), o tiene deposiciones acuosas (diarrea).  Advierte un olor ftido que proviene de la vagina.  Siente dolor al hacer pis (orinar). SOLICITE AYUDA DE INMEDIATO SI:   Tiene fiebre.  Pierde lquido por la vagina.  Tiene sangrando o pequeas prdidas vaginales.  Siente dolor intenso o clicos en el abdomen.  Sube o baja de peso rpidamente.  Tiene dificultad para respirar o siente dolor en el pecho.  Sbitamente se le hinchan el rostro, las manos, los tobillos, los pies o las piernas.  No ha sentido los movimientos del beb durante una hora.  Siente un dolor de cabeza intenso que no se alivia con medicamentos.  Su visin se modifica. Document Released: 10/12/2012 ExitCare Patient Information 2015 [employer]. This information is not intended to replace advice given to you by your health care provider. Make sure you   discuss any questions you have with your health care provider.  

## 2013-11-18 LAB — PRESCRIPTION MONITORING PROFILE (19 PANEL)
Amphetamine/Meth: NEGATIVE ng/mL
Barbiturate Screen, Urine: NEGATIVE ng/mL
Benzodiazepine Screen, Urine: NEGATIVE ng/mL
Buprenorphine, Urine: NEGATIVE ng/mL
COCAINE METABOLITES: NEGATIVE ng/mL
Cannabinoid Scrn, Ur: NEGATIVE ng/mL
Carisoprodol, Urine: NEGATIVE ng/mL
Creatinine, Urine: 170.73 mg/dL (ref >20.0)
ECSTASY: NEGATIVE ng/mL
Fentanyl, Ur: NEGATIVE ng/mL
MEPERIDINE UR: NEGATIVE ng/mL
METHAQUALONE SCREEN (URINE): NEGATIVE ng/mL
Methadone Screen, Urine: NEGATIVE ng/mL
Nitrites, Initial: NEGATIVE ug/mL
Opiate Screen, Urine: NEGATIVE ng/mL
Oxycodone Screen, Ur: NEGATIVE ng/mL
PH URINE, INITIAL: 7.7 pH (ref 4.5–8.9)
Phencyclidine, Ur: NEGATIVE ng/mL
Propoxyphene: NEGATIVE ng/mL
TRAMADOL UR: NEGATIVE ng/mL
Tapentadol, urine: NEGATIVE ng/mL
ZOLPIDEM, URINE: NEGATIVE ng/mL

## 2013-11-22 ENCOUNTER — Other Ambulatory Visit (HOSPITAL_COMMUNITY): Payer: No Typology Code available for payment source

## 2013-11-23 ENCOUNTER — Ambulatory Visit (HOSPITAL_COMMUNITY)
Admission: RE | Admit: 2013-11-23 | Discharge: 2013-11-23 | Disposition: A | Discharge status: Home / Self Care | Payer: No Typology Code available for payment source | Source: Ambulatory Visit | Attending: Family Medicine | Admitting: Family Medicine

## 2013-11-23 DIAGNOSIS — O09892 Supervision of other high risk pregnancies, second trimester: Secondary | ICD-10-CM

## 2013-11-23 DIAGNOSIS — Z3201 Encounter for pregnancy test, result positive: Secondary | ICD-10-CM

## 2013-11-23 DIAGNOSIS — Z3492 Encounter for supervision of normal pregnancy, unspecified, second trimester: Secondary | ICD-10-CM | POA: Insufficient documentation

## 2013-11-23 DIAGNOSIS — O09212 Supervision of pregnancy with history of pre-term labor, second trimester: Secondary | ICD-10-CM

## 2013-11-23 DIAGNOSIS — Z3A21 21 weeks gestation of pregnancy: Secondary | ICD-10-CM

## 2013-11-23 DIAGNOSIS — Z3689 Encounter for other specified antenatal screening: Secondary | ICD-10-CM

## 2013-12-07 ENCOUNTER — Telehealth: Payer: Self-pay | Admitting: *Deleted

## 2013-12-07 NOTE — Telephone Encounter (Signed)
Called patient with interpreter Dori to inform her that her 17P is here and she needs to come get her first injection either today or tomorrow. Message left on voicemail to call us back to set up an appointment.

## 2013-12-12 NOTE — Telephone Encounter (Signed)
Called patient with language line spanish interpreter Jari FavreOscar. Patients home number went straight to a voicemail that is not set up. The second, her mobile number patient did not answer, interpreter left message for patient informing her that we are trying to reach her about scheduling an appointment.

## 2013-12-13 NOTE — Telephone Encounter (Signed)
Patient scheduled for tomorrow, note made on visit to start 17P.

## 2013-12-14 ENCOUNTER — Ambulatory Visit (INDEPENDENT_AMBULATORY_CARE_PROVIDER_SITE_OTHER): Payer: Self-pay | Admitting: Obstetrics & Gynecology

## 2013-12-14 VITALS — BP 98/60 | HR 78 | Wt 161.4 lb

## 2013-12-14 DIAGNOSIS — O09212 Supervision of pregnancy with history of pre-term labor, second trimester: Secondary | ICD-10-CM

## 2013-12-14 DIAGNOSIS — O09892 Supervision of other high risk pregnancies, second trimester: Secondary | ICD-10-CM

## 2013-12-14 NOTE — Progress Notes (Signed)
Used [employer] interpreter Dori.  Pt reports discharge with odor.  Pt to start 17P today.

## 2013-12-14 NOTE — Patient Instructions (Addendum)
Segundo trimestre de embarazo (Second Trimester of Pregnancy) El segundo trimestre va desde la semana13 hasta la 28, desde el cuarto hasta el sexto mes, y suele ser el momento en el que mejor se siente. Su organismo se ha adaptado a estar embarazada y comienza a sentirse fsicamente mejor. En general, las nuseas matutinas han disminuido o han desaparecido completamente, p El segundo trimestre es tambin la poca en la que el feto se desarrolla rpidamente. Hacia el final del sexto mes, el feto mide aproximadamente 9pulgadas (23cm) y pesa alrededor de 1 libras (700g). Es probable que sienta que el beb se mueve (da pataditas) entre las 18 y 20semanas del embarazo. CAMBIOS EN EL ORGANISMO Su organismo atraviesa por muchos cambios durante el embarazo, y estos varan de una mujer a otra.   Seguir aumentando de peso. Notar que la parte baja del abdomen sobresale.  Podrn aparecer las primeras estras en las caderas, el abdomen y las mamas.  Es posible que tenga dolores de cabeza que pueden aliviarse con los medicamentos que su mdico autorice.  Tal vez tenga necesidad de orinar con ms frecuencia porque el feto est ejerciendo presin sobre la vejiga.  Debido al embarazo podr sentir acidez estomacal con frecuencia.  Puede estar estreida, ya que ciertas hormonas enlentecen los movimientos de los msculos que empujan los desechos a travs de los intestinos.  Pueden aparecer hemorroides o abultarse e hincharse las venas (venas varicosas).  Puede tener dolor de espalda que se debe al aumento de peso y a que las hormonas del embarazo relajan las articulaciones entre los huesos de la pelvis, y como consecuencia de la modificacin del peso y los msculos que mantienen el equilibrio.  Las mamas seguirn creciendo y le dolern.  Las encas pueden sangrar y estar sensibles al cepillado y al hilo dental.  Pueden aparecer zonas oscuras o manchas (cloasma, mscara del embarazo) en el rostro que  probablemente se atenuarn despus del nacimiento del beb.  Es posible que se forme una lnea oscura desde el ombligo hasta la zona del pubis (linea nigra) que probablemente se atenuarn despus del nacimiento del beb.  Tal vez haya cambios en el cabello que pueden incluir su engrosamiento, crecimiento rpido y cambios en la textura. Adems, a algunas mujeres se les cae el cabello durante o despus del embarazo, o tienen el cabello seco o fino. Lo ms probable es que el cabello se le normalice despus del nacimiento del beb. QU DEBE ESPERAR EN LAS CONSULTAS PRENATALES Durante una visita prenatal de rutina:  La pesarn para asegurarse de que usted y el feto estn creciendo normalmente.  Le tomarn la presin arterial.  Le medirn el abdomen para controlar el desarrollo del beb.  Se escucharn los latidos cardacos fetales.  Se evaluarn los resultados de los estudios solicitados en visitas anteriores. El mdico puede preguntarle lo siguiente:  Cmo se siente.  Si siente los movimientos del beb.  Si ha tenido sntomas anormales, como prdida de lquido, sangrado, dolores de cabeza intensos o clicos abdominales.  Si tiene alguna pregunta. Otros estudios que podrn realizarse durante el segundo trimestre incluyen lo siguiente:  Anlisis de sangre para detectar:  Concentraciones de hierro bajas (anemia).  Diabetes gestacional (entre la semana 24 y la 28).  Anticuerpos Rh.  Anlisis de orina para detectar infecciones, diabetes o protenas en la orina.  Una ecografa para confirmar que el beb crece y se desarrolla correctamente.  Una amniocentesis para diagnosticar posibles problemas genticos.  Estudios del feto para descartar espina   bfida y sndrome de Down. INSTRUCCIONES PARA EL CUIDADO EN EL HOGAR   Evite fumar, consumir hierbas, beber alcohol y tomar frmacos que no le hayan recetado. Estas sustancias qumicas afectan la formacin y el desarrollo del beb.  Siga  las indicaciones del mdico en relacin con el uso de medicamentos. Durante el embarazo, hay medicamentos que son seguros de tomar y otros que no.  Haga actividad fsica solo en la forma indicada por el mdico. Sentir clicos uterinos es un buen signo para detener la actividad fsica.  Contine comiendo alimentos que sanos con regularidad.  Use un sostn que le brinde buen soporte si le duelen las mamas.  No se d baos de inmersin en agua caliente, baos turcos ni saunas.  Colquese el cinturn de seguridad cuando conduzca.  No coma carne cruda ni queso sin cocinar; evite el contacto con las bandejas sanitarias de los gatos y la tierra que estos animales usan. Estos elementos contienen grmenes que pueden causar defectos congnitos en el beb.  Tome las vitaminas prenatales.  Si est estreida, pruebe un laxante suave (si el mdico lo autoriza). Consuma ms alimentos ricos en fibra, como vegetales y frutas frescos y cereales integrales. Beba gran cantidad de lquido para mantener la orina de tono claro o color amarillo plido.  Dese baos de asiento con agua tibia para aliviar el dolor o las molestias causadas por las hemorroides. Use una crema para las hemorroides si el mdico la autoriza.  Si tiene venas varicosas, use medias de descanso. Eleve los pies durante 15minutos, 3 o 4veces por da. Limite la cantidad de sal en su dieta.  No levante objetos pesados, use zapatos de tacones bajos y mantenga una buena postura.  Descanse con las piernas elevadas si tiene calambres o dolor de cintura.  Visite a su dentista si an no lo ha hecho durante el embarazo. Use un cepillo de dientes blando para higienizarse los dientes y psese el hilo dental con suavidad.  Puede seguir manteniendo relaciones sexuales, a menos que el mdico le indique lo contrario.  Concurra a todas las visitas prenatales segn las indicaciones de su mdico. SOLICITE ATENCIN MDICA SI:   Tiene mareos.  Siente  clicos leves, presin en la pelvis o dolor persistente en el abdomen.  Tiene nuseas, vmitos o diarrea persistentes.  Tiene secrecin vaginal con mal olor.  Siente dolor al orinar. SOLICITE ATENCIN MDICA DE INMEDIATO SI:   Tiene fiebre.  Tiene una prdida de lquido por la vagina.  Tiene sangrado o pequeas prdidas vaginales.  Siente dolor intenso o clicos en el abdomen.  Sube o baja de peso rpidamente.  Tiene dificultad para respirar y siente dolor de pecho.  Sbitamente se le hinchan mucho el rostro, las manos, los tobillos, los pies o las piernas.  No ha sentido los movimientos del beb durante una hora.  Siente un dolor de cabeza intenso que no se alivia con medicamentos.  Hay cambios en la visin. Document Released: 11/19/2004 Document Revised: 02/14/2013 ExitCare Patient Information 2015 [employer]. This information is not intended to replace advice given to you by your health care provider. Make sure you discuss any questions you have with your health care provider.  

## 2013-12-14 NOTE — Progress Notes (Signed)
US c/w dates on 11/23/13. Reviewed notes [facility] 2004 delivered due to IUGR and oligo, no PTL, no 17 P this pregnancy

## 2013-12-18 ENCOUNTER — Telehealth: Payer: Self-pay

## 2013-12-18 NOTE — Telephone Encounter (Signed)
Called patient with interpreter Lavell AnchorsSilvia Sobvalarro. Informed her that 17P is here. Patient states at her last appointment the doctor informed her she was not going to start it because she is already 6 months into her pregnancy. Discussed patient with Dr. Debroah LoopArnold (who last saw patient) who states patient should not start medication at this time as it is not shown to be effective. Informed patient she is correct and we will not start medication. Apologized for the phone call and confusion. Patient verbalized understanding. No questions or concerns.

## 2013-12-18 NOTE — Telephone Encounter (Signed)
Message copied by Louanna RawAMPBELL, Sanna Porcaro M on Mon Dec 18, 2013  4:10 PM ------      Message from: Darrel HooverASSETTE, KELLY P      Created: Mon Dec 18, 2013 11:54 AM      Regarding: Please call       Please call patient.  We have 17P.  Needs to come in to start.  Please see when she can come in and ask front desk to schedule appt.      Thanks,      Tresa EndoKelly ------

## 2013-12-25 ENCOUNTER — Encounter: Payer: Self-pay | Admitting: Family Medicine

## 2014-01-16 ENCOUNTER — Ambulatory Visit (INDEPENDENT_AMBULATORY_CARE_PROVIDER_SITE_OTHER): Payer: Self-pay | Admitting: Obstetrics & Gynecology

## 2014-01-16 VITALS — BP 109/63 | HR 82 | Temp 98.2°F | Wt 167.8 lb

## 2014-01-16 DIAGNOSIS — Z87898 Personal history of other specified conditions: Secondary | ICD-10-CM

## 2014-01-16 DIAGNOSIS — Z3493 Encounter for supervision of normal pregnancy, unspecified, third trimester: Secondary | ICD-10-CM

## 2014-01-16 DIAGNOSIS — Z23 Encounter for immunization: Secondary | ICD-10-CM

## 2014-01-16 DIAGNOSIS — O09892 Supervision of other high risk pregnancies, second trimester: Secondary | ICD-10-CM

## 2014-01-16 DIAGNOSIS — O09212 Supervision of pregnancy with history of pre-term labor, second trimester: Secondary | ICD-10-CM

## 2014-01-16 LAB — CBC
HCT: 37.9 % (ref 36.0–46.0)
Hemoglobin: 12.5 g/dL (ref 12.0–15.0)
MCH: 26.4 pg (ref 26.0–34.0)
MCHC: 33 g/dL (ref 30.0–36.0)
MCV: 80.1 fL (ref 78.0–100.0)
MPV: 11 fL (ref 9.4–12.4)
PLATELETS: 171 10*3/uL (ref 150–400)
RBC: 4.73 MIL/uL (ref 3.87–5.11)
RDW: 13.6 % (ref 11.5–15.5)
WBC: 8.5 10*3/uL (ref 4.0–10.5)

## 2014-01-16 MED ORDER — TETANUS-DIPHTH-ACELL PERTUSSIS 5-2.5-18.5 LF-MCG/0.5 IM SUSP
0.5000 mL | Freq: Once | INTRAMUSCULAR | Status: AC
  Administered 2014-01-16: 0.5 mL via INTRAMUSCULAR

## 2014-01-16 NOTE — Progress Notes (Signed)
Kristen PelSylvia Sobalvarro present for [employer] Interpreter. Pt reports decrease in fetal movement for 2 weeks but patient reports fetal movement everyday.

## 2014-01-16 NOTE — Progress Notes (Signed)
Occasional BHC, no VB or ROM  Discussed decreased fetal movement and H/O IUGR first pregnancy. Schedule US for growth and BPP. She is concerned about cost.

## 2014-01-16 NOTE — Patient Instructions (Signed)
Tercer trimestre de embarazo (Third Trimester of Pregnancy) El tercer trimestre va desde la semana29 hasta la 42, desde el sptimo hasta el noveno mes, y es la poca en la que el feto crece ms rpidamente. Hacia el final del noveno mes, el feto mide alrededor de 20pulgadas (45cm) de largo y pesa entre 6 y 10 libras (2,700 y 4,500kg).  CAMBIOS EN EL ORGANISMO Su organismo atraviesa por muchos cambios durante el embarazo, y estos varan de una mujer a otra.   Seguir aumentando de peso. Es de esperar que aumente entre 25 y 35libras (11 y 16kg) hacia el final del embarazo.  Podrn aparecer las primeras estras en las caderas, el abdomen y las mamas.  Puede tener necesidad de orinar con ms frecuencia porque el feto baja hacia la pelvis y ejerce presin sobre la vejiga.  Debido al embarazo podr sentir acidez estomacal con frecuencia.  Puede estar estreida, ya que ciertas hormonas enlentecen los movimientos de los msculos que empujan los desechos a travs de los intestinos.  Pueden aparecer hemorroides o abultarse e hincharse las venas (venas varicosas).  Puede sentir dolor plvico debido al aumento de peso y a que las hormonas del embarazo relajan las articulaciones entre los huesos de la pelvis. El dolor de espalda puede ser consecuencia de la sobrecarga de los msculos que soportan la postura.  Tal vez haya cambios en el cabello que pueden incluir su engrosamiento, crecimiento rpido y cambios en la textura. Adems, a algunas mujeres se les cae el cabello durante o despus del embarazo, o tienen el cabello seco o fino. Lo ms probable es que el cabello se le normalice despus del nacimiento del beb.  Las mamas seguirn creciendo y le dolern. A veces, puede haber una secrecin amarilla de las mamas llamada calostro.  El ombligo puede salir hacia afuera.  Puede sentir que le falta el aire debido a que se expande el tero.  Puede notar que el feto "baja" o lo siente ms bajo, en el  abdomen.  Puede tener una prdida de secrecin mucosa con sangre. Esto suele ocurrir en el trmino de unos pocos das a una semana antes de que comience el trabajo de parto.  El cuello del tero se vuelve delgado y blando (se borra) cerca de la fecha de parto. QU DEBE ESPERAR EN LOS EXMENES PRENATALES  Le harn exmenes prenatales cada 2semanas hasta la semana36. A partir de ese momento le harn exmenes semanales. Durante una visita prenatal de rutina:  La pesarn para asegurarse de que usted y el feto estn creciendo normalmente.  Le tomarn la presin arterial.  Le medirn el abdomen para controlar el desarrollo del beb.  Se escucharn los latidos cardacos fetales.  Se evaluarn los resultados de los estudios solicitados en visitas anteriores.  Le revisarn el cuello del tero cuando est prxima la fecha de parto para controlar si este se ha borrado. Alrededor de la semana36, el mdico le revisar el cuello del tero. Al mismo tiempo, realizar un anlisis de las secreciones del tejido vaginal. Este examen es para determinar si hay un tipo de bacteria, estreptococo Grupo B. El mdico le explicar esto con ms detalle. El mdico puede preguntarle lo siguiente:  Cmo le gustara que fuera el parto.  Cmo se siente.  Si siente los movimientos del beb.  Si ha tenido sntomas anormales, como prdida de lquido, sangrado, dolores de cabeza intensos o clicos abdominales.  Si tiene alguna pregunta. Otros exmenes o estudios de deteccin que pueden realizarse   durante el tercer trimestre incluyen lo siguiente:  Anlisis de sangre para controlar las concentraciones de hierro (anemia).  Controles fetales para determinar su salud, nivel de actividad y crecimiento. Si tiene alguna enfermedad o hay problemas durante el embarazo, le harn estudios. FALSO TRABAJO DE PARTO Es posible que sienta contracciones leves e irregulares que finalmente desaparecen. Se llaman contracciones de  Braxton Hicks o falso trabajo de parto. Las contracciones pueden durar horas, das o incluso semanas, antes de que el verdadero trabajo de parto se inicie. Si las contracciones ocurren a intervalos regulares, se intensifican o se hacen dolorosas, lo mejor es que la revise el mdico.  SIGNOS DE TRABAJO DE PARTO   Clicos de tipo menstrual.  Contracciones cada 5minutos o menos.  Contracciones que comienzan en la parte superior del tero y se extienden hacia abajo, a la zona inferior del abdomen y la espalda.  Sensacin de mayor presin en la pelvis o dolor de espalda.  Una secrecin de mucosidad acuosa o con sangre que sale de la vagina. Si tiene alguno de estos signos antes de la semana37 del embarazo, llame a su mdico de inmediato. Debe concurrir al hospital para que la controlen inmediatamente. INSTRUCCIONES PARA EL CUIDADO EN EL HOGAR   Evite fumar, consumir hierbas, beber alcohol y tomar frmacos que no le hayan recetado. Estas sustancias qumicas afectan la formacin y el desarrollo del beb.  Siga las indicaciones del mdico en relacin con el uso de medicamentos. Durante el embarazo, hay medicamentos que son seguros de tomar y otros que no.  Haga actividad fsica solo en la forma indicada por el mdico. Sentir clicos uterinos es un buen signo para detener la actividad fsica.  Contine comiendo alimentos que sanos con regularidad.  Use un sostn que le brinde buen soporte si le duelen las mamas.  No se d baos de inmersin en agua caliente, baos turcos ni saunas.  Colquese el cinturn de seguridad cuando conduzca.  No coma carne cruda ni queso sin cocinar; evite el contacto con las bandejas sanitarias de los gatos y la tierra que estos animales usan. Estos elementos contienen grmenes que pueden causar defectos congnitos en el beb.  Tome las vitaminas prenatales.  Si est estreida, pruebe un laxante suave (si el mdico lo autoriza). Consuma ms alimentos ricos en  fibra, como vegetales y frutas frescos y cereales integrales. Beba gran cantidad de lquido para mantener la orina de tono claro o color amarillo plido.  Dese baos de asiento con agua tibia para aliviar el dolor o las molestias causadas por las hemorroides. Use una crema para las hemorroides si el mdico la autoriza.  Si tiene venas varicosas, use medias de descanso. Eleve los pies durante 15minutos, 3 o 4veces por da. Limite la cantidad de sal en su dieta.  Evite levantar objetos pesados, use zapatos de tacones bajos y mantenga una buena postura.  Descanse con las piernas elevadas si tiene calambres o dolor de cintura.  Visite a su dentista si no lo ha hecho durante el embarazo. Use un cepillo de dientes blando para higienizarse los dientes y psese el hilo dental con suavidad.  Puede seguir manteniendo relaciones sexuales, a menos que el mdico le indique lo contrario.  No haga viajes largos excepto que sea absolutamente necesario y solo con la autorizacin del mdico.  Tome clases prenatales para entender, practicar y hacer preguntas sobre el trabajo de parto y el parto.  Haga un ensayo de la partida al hospital.  Prepare el bolso que   llevar al hospital.  Prepare la habitacin del beb.  Concurra a todas las visitas prenatales segn las indicaciones de su mdico. SOLICITE ATENCIN MDICA SI:  No est segura de que est en trabajo de parto o de que ha roto la bolsa de las aguas.  Tiene mareos.  Siente clicos leves, presin en la pelvis o dolor persistente en el abdomen.  Tiene nuseas, vmitos o diarrea persistentes.  Tiene secrecin vaginal con mal olor.  Siente dolor al orinar. SOLICITE ATENCIN MDICA DE INMEDIATO SI:   Tiene fiebre.  Tiene una prdida de lquido por la vagina.  Tiene sangrado o pequeas prdidas vaginales.  Siente dolor intenso o clicos en el abdomen.  Sube o baja de peso rpidamente.  Tiene dificultad para respirar y siente dolor de  pecho.  Sbitamente se le hinchan mucho el rostro, las manos, los tobillos, los pies o las piernas.  No ha sentido los movimientos del beb durante una hora.  Siente un dolor de cabeza intenso que no se alivia con medicamentos.  Hay cambios en la visin. Document Released: 11/19/2004 Document Revised: 02/14/2013 [employer] Patient Information 2015 [employer]. This information is not intended to replace advice given to you by your health care provider. Make sure you discuss any questions you have with your health care provider.  

## 2014-01-17 ENCOUNTER — Ambulatory Visit (HOSPITAL_COMMUNITY): Payer: No Typology Code available for payment source

## 2014-01-17 LAB — POCT URINALYSIS DIP (DEVICE)
Bilirubin Urine: NEGATIVE
GLUCOSE, UA: NEGATIVE mg/dL
HGB URINE DIPSTICK: NEGATIVE
Ketones, ur: NEGATIVE mg/dL
NITRITE: NEGATIVE
PH: 7 (ref 5.0–8.0)
Protein, ur: NEGATIVE mg/dL
Specific Gravity, Urine: 1.015 (ref 1.005–1.030)
Urobilinogen, UA: 0.2 mg/dL (ref 0.0–1.0)

## 2014-01-17 LAB — GLUCOSE TOLERANCE, 1 HOUR (50G) W/O FASTING: GLUCOSE 1 HOUR GTT: 128 mg/dL (ref 70–140)

## 2014-01-17 LAB — HIV ANTIBODY (ROUTINE TESTING W REFLEX): HIV 1&2 Ab, 4th Generation: NONREACTIVE

## 2014-01-17 LAB — RPR

## 2014-01-23 ENCOUNTER — Ambulatory Visit (HOSPITAL_COMMUNITY): Payer: Self-pay

## 2014-01-24 ENCOUNTER — Ambulatory Visit: Payer: Self-pay

## 2014-01-25 ENCOUNTER — Ambulatory Visit: Payer: Self-pay

## 2014-01-30 ENCOUNTER — Ambulatory Visit (INDEPENDENT_AMBULATORY_CARE_PROVIDER_SITE_OTHER): Payer: Self-pay | Admitting: Obstetrics & Gynecology

## 2014-01-30 ENCOUNTER — Encounter: Payer: Self-pay | Admitting: Obstetrics & Gynecology

## 2014-01-30 VITALS — BP 107/63 | HR 93 | Wt 169.4 lb

## 2014-01-30 DIAGNOSIS — O09212 Supervision of pregnancy with history of pre-term labor, second trimester: Secondary | ICD-10-CM

## 2014-01-30 DIAGNOSIS — O09892 Supervision of other high risk pregnancies, second trimester: Secondary | ICD-10-CM

## 2014-01-30 LAB — POCT URINALYSIS DIP (DEVICE)
BILIRUBIN URINE: NEGATIVE
Glucose, UA: NEGATIVE mg/dL
HGB URINE DIPSTICK: NEGATIVE
KETONES UR: NEGATIVE mg/dL
Nitrite: NEGATIVE
PH: 7.5 (ref 5.0–8.0)
PROTEIN: NEGATIVE mg/dL
Specific Gravity, Urine: 1.015 (ref 1.005–1.030)
Urobilinogen, UA: 0.2 mg/dL (ref 0.0–1.0)

## 2014-01-30 NOTE — Progress Notes (Signed)
Ultrasound appt made for tomorrow 12/9 @ 3:45

## 2014-01-30 NOTE — Progress Notes (Signed)
Did not have US due to cost. Will try to arrange for her. Explained the risk based on previous h/o IUGR

## 2014-01-30 NOTE — Patient Instructions (Signed)
Tercer trimestre de embarazo (Third Trimester of Pregnancy) El tercer trimestre va desde la semana29 hasta la 42, desde el sptimo hasta el noveno mes, y es la poca en la que el feto crece ms rpidamente. Hacia el final del noveno mes, el feto mide alrededor de 20pulgadas (45cm) de largo y pesa entre 6 y 10 libras (2,700 y 4,500kg).  CAMBIOS EN EL ORGANISMO Su organismo atraviesa por muchos cambios durante el embarazo, y estos varan de una mujer a otra.   Seguir aumentando de peso. Es de esperar que aumente entre 25 y 35libras (11 y 16kg) hacia el final del embarazo.  Podrn aparecer las primeras estras en las caderas, el abdomen y las mamas.  Puede tener necesidad de orinar con ms frecuencia porque el feto baja hacia la pelvis y ejerce presin sobre la vejiga.  Debido al embarazo podr sentir acidez estomacal con frecuencia.  Puede estar estreida, ya que ciertas hormonas enlentecen los movimientos de los msculos que empujan los desechos a travs de los intestinos.  Pueden aparecer hemorroides o abultarse e hincharse las venas (venas varicosas).  Puede sentir dolor plvico debido al aumento de peso y a que las hormonas del embarazo relajan las articulaciones entre los huesos de la pelvis. El dolor de espalda puede ser consecuencia de la sobrecarga de los msculos que soportan la postura.  Tal vez haya cambios en el cabello que pueden incluir su engrosamiento, crecimiento rpido y cambios en la textura. Adems, a algunas mujeres se les cae el cabello durante o despus del embarazo, o tienen el cabello seco o fino. Lo ms probable es que el cabello se le normalice despus del nacimiento del beb.  Las mamas seguirn creciendo y le dolern. A veces, puede haber una secrecin amarilla de las mamas llamada calostro.  El ombligo puede salir hacia afuera.  Puede sentir que le falta el aire debido a que se expande el tero.  Puede notar que el feto "baja" o lo siente ms bajo, en el  abdomen.  Puede tener una prdida de secrecin mucosa con sangre. Esto suele ocurrir en el trmino de unos pocos das a una semana antes de que comience el trabajo de parto.  El cuello del tero se vuelve delgado y blando (se borra) cerca de la fecha de parto. QU DEBE ESPERAR EN LOS EXMENES PRENATALES  Le harn exmenes prenatales cada 2semanas hasta la semana36. A partir de ese momento le harn exmenes semanales. Durante una visita prenatal de rutina:  La pesarn para asegurarse de que usted y el feto estn creciendo normalmente.  Le tomarn la presin arterial.  Le medirn el abdomen para controlar el desarrollo del beb.  Se escucharn los latidos cardacos fetales.  Se evaluarn los resultados de los estudios solicitados en visitas anteriores.  Le revisarn el cuello del tero cuando est prxima la fecha de parto para controlar si este se ha borrado. Alrededor de la semana36, el mdico le revisar el cuello del tero. Al mismo tiempo, realizar un anlisis de las secreciones del tejido vaginal. Este examen es para determinar si hay un tipo de bacteria, estreptococo Grupo B. El mdico le explicar esto con ms detalle. El mdico puede preguntarle lo siguiente:  Cmo le gustara que fuera el parto.  Cmo se siente.  Si siente los movimientos del beb.  Si ha tenido sntomas anormales, como prdida de lquido, sangrado, dolores de cabeza intensos o clicos abdominales.  Si tiene alguna pregunta. Otros exmenes o estudios de deteccin que pueden realizarse   durante el tercer trimestre incluyen lo siguiente:  Anlisis de sangre para controlar las concentraciones de hierro (anemia).  Controles fetales para determinar su salud, nivel de actividad y crecimiento. Si tiene alguna enfermedad o hay problemas durante el embarazo, le harn estudios. FALSO TRABAJO DE PARTO Es posible que sienta contracciones leves e irregulares que finalmente desaparecen. Se llaman contracciones de  Braxton Hicks o falso trabajo de parto. Las contracciones pueden durar horas, das o incluso semanas, antes de que el verdadero trabajo de parto se inicie. Si las contracciones ocurren a intervalos regulares, se intensifican o se hacen dolorosas, lo mejor es que la revise el mdico.  SIGNOS DE TRABAJO DE PARTO   Clicos de tipo menstrual.  Contracciones cada 5minutos o menos.  Contracciones que comienzan en la parte superior del tero y se extienden hacia abajo, a la zona inferior del abdomen y la espalda.  Sensacin de mayor presin en la pelvis o dolor de espalda.  Una secrecin de mucosidad acuosa o con sangre que sale de la vagina. Si tiene alguno de estos signos antes de la semana37 del embarazo, llame a su mdico de inmediato. Debe concurrir al hospital para que la controlen inmediatamente. INSTRUCCIONES PARA EL CUIDADO EN EL HOGAR   Evite fumar, consumir hierbas, beber alcohol y tomar frmacos que no le hayan recetado. Estas sustancias qumicas afectan la formacin y el desarrollo del beb.  Siga las indicaciones del mdico en relacin con el uso de medicamentos. Durante el embarazo, hay medicamentos que son seguros de tomar y otros que no.  Haga actividad fsica solo en la forma indicada por el mdico. Sentir clicos uterinos es un buen signo para detener la actividad fsica.  Contine comiendo alimentos que sanos con regularidad.  Use un sostn que le brinde buen soporte si le duelen las mamas.  No se d baos de inmersin en agua caliente, baos turcos ni saunas.  Colquese el cinturn de seguridad cuando conduzca.  No coma carne cruda ni queso sin cocinar; evite el contacto con las bandejas sanitarias de los gatos y la tierra que estos animales usan. Estos elementos contienen grmenes que pueden causar defectos congnitos en el beb.  Tome las vitaminas prenatales.  Si est estreida, pruebe un laxante suave (si el mdico lo autoriza). Consuma ms alimentos ricos en  fibra, como vegetales y frutas frescos y cereales integrales. Beba gran cantidad de lquido para mantener la orina de tono claro o color amarillo plido.  Dese baos de asiento con agua tibia para aliviar el dolor o las molestias causadas por las hemorroides. Use una crema para las hemorroides si el mdico la autoriza.  Si tiene venas varicosas, use medias de descanso. Eleve los pies durante 15minutos, 3 o 4veces por da. Limite la cantidad de sal en su dieta.  Evite levantar objetos pesados, use zapatos de tacones bajos y mantenga una buena postura.  Descanse con las piernas elevadas si tiene calambres o dolor de cintura.  Visite a su dentista si no lo ha hecho durante el embarazo. Use un cepillo de dientes blando para higienizarse los dientes y psese el hilo dental con suavidad.  Puede seguir manteniendo relaciones sexuales, a menos que el mdico le indique lo contrario.  No haga viajes largos excepto que sea absolutamente necesario y solo con la autorizacin del mdico.  Tome clases prenatales para entender, practicar y hacer preguntas sobre el trabajo de parto y el parto.  Haga un ensayo de la partida al hospital.  Prepare el bolso que   llevar al hospital.  Prepare la habitacin del beb.  Concurra a todas las visitas prenatales segn las indicaciones de su mdico. SOLICITE ATENCIN MDICA SI:  No est segura de que est en trabajo de parto o de que ha roto la bolsa de las aguas.  Tiene mareos.  Siente clicos leves, presin en la pelvis o dolor persistente en el abdomen.  Tiene nuseas, vmitos o diarrea persistentes.  Tiene secrecin vaginal con mal olor.  Siente dolor al orinar. SOLICITE ATENCIN MDICA DE INMEDIATO SI:   Tiene fiebre.  Tiene una prdida de lquido por la vagina.  Tiene sangrado o pequeas prdidas vaginales.  Siente dolor intenso o clicos en el abdomen.  Sube o baja de peso rpidamente.  Tiene dificultad para respirar y siente dolor de  pecho.  Sbitamente se le hinchan mucho el rostro, las manos, los tobillos, los pies o las piernas.  No ha sentido los movimientos del beb durante una hora.  Siente un dolor de cabeza intenso que no se alivia con medicamentos.  Hay cambios en la visin. Document Released: 11/19/2004 Document Revised: 02/14/2013 [employer] Patient Information 2015 [employer]. This information is not intended to replace advice given to you by your health care provider. Make sure you discuss any questions you have with your health care provider.  

## 2014-01-31 ENCOUNTER — Ambulatory Visit (HOSPITAL_COMMUNITY): Admission: RE | Admit: 2014-01-31 | Payer: Self-pay | Source: Ambulatory Visit

## 2014-02-13 ENCOUNTER — Ambulatory Visit (INDEPENDENT_AMBULATORY_CARE_PROVIDER_SITE_OTHER): Payer: Self-pay | Admitting: Obstetrics and Gynecology

## 2014-02-13 ENCOUNTER — Encounter: Payer: Self-pay | Admitting: Obstetrics and Gynecology

## 2014-02-13 VITALS — BP 116/59 | HR 86 | Temp 97.9°F | Wt 171.4 lb

## 2014-02-13 DIAGNOSIS — O09892 Supervision of other high risk pregnancies, second trimester: Secondary | ICD-10-CM

## 2014-02-13 DIAGNOSIS — O09212 Supervision of pregnancy with history of pre-term labor, second trimester: Secondary | ICD-10-CM

## 2014-02-13 DIAGNOSIS — Z87898 Personal history of other specified conditions: Secondary | ICD-10-CM

## 2014-02-13 LAB — POCT URINALYSIS DIP (DEVICE)
Bilirubin Urine: NEGATIVE
Glucose, UA: NEGATIVE mg/dL
Hgb urine dipstick: NEGATIVE
Ketones, ur: NEGATIVE mg/dL
Nitrite: NEGATIVE
PROTEIN: NEGATIVE mg/dL
SPECIFIC GRAVITY, URINE: 1.015 (ref 1.005–1.030)
Urobilinogen, UA: 0.2 mg/dL (ref 0.0–1.0)
pH: 7 (ref 5.0–8.0)

## 2014-02-13 NOTE — Progress Notes (Signed)
Patient is doing well without complaints. FM/PTL precautions reviewed. Patient declined follow up anatomy ultrasound secondary to cost

## 2014-02-23 NOTE — L&D Delivery Note (Signed)
Delivery Note At 11:53 AM a viable and healthy female was delivered via Vaginal, Spontaneous Delivery (Presentation: Left Occiput Anterior).  APGAR: 9, 9.   Placenta status: Intact, Spontaneous.  Cord: 3 vessels with the following complications: None.   Anesthesia: Epidural  Episiotomy: None Lacerations: 2nd degree by Dr Pollie MeyerMcIntyre Suture Repair: 4-0 vicryl  Est. Blood Loss (mL): 200  Mom to postpartum.  Baby to Couplet care / Skin to Skin.  Dr Pollie MeyerMcIntyre participated in this delivery.  Dr Loreta AveAcosta was present for entire delivery.  Kristen Davila, Kristen Davila 04/03/2014, 12:25 PM

## 2014-03-01 ENCOUNTER — Encounter: Payer: Self-pay | Admitting: Obstetrics & Gynecology

## 2014-03-01 ENCOUNTER — Ambulatory Visit (INDEPENDENT_AMBULATORY_CARE_PROVIDER_SITE_OTHER): Payer: Self-pay | Admitting: Obstetrics & Gynecology

## 2014-03-01 VITALS — BP 100/57 | HR 87 | Temp 98.0°F | Wt 176.3 lb

## 2014-03-01 DIAGNOSIS — O09212 Supervision of pregnancy with history of pre-term labor, second trimester: Secondary | ICD-10-CM

## 2014-03-01 DIAGNOSIS — O09892 Supervision of other high risk pregnancies, second trimester: Secondary | ICD-10-CM

## 2014-03-01 LAB — POCT URINALYSIS DIP (DEVICE)
Bilirubin Urine: NEGATIVE
GLUCOSE, UA: NEGATIVE mg/dL
Hgb urine dipstick: NEGATIVE
Ketones, ur: NEGATIVE mg/dL
NITRITE: NEGATIVE
Protein, ur: NEGATIVE mg/dL
SPECIFIC GRAVITY, URINE: 1.015 (ref 1.005–1.030)
UROBILINOGEN UA: 0.2 mg/dL (ref 0.0–1.0)
pH: 7.5 (ref 5.0–8.0)

## 2014-03-01 NOTE — Patient Instructions (Signed)
Estreimiento (Constipation) Estreimiento significa que una persona tiene menos de tres evacuaciones en una semana, dificultad para defecar, o que las heces son secas, duras, o ms grandes que lo normal. A medida que envejecemos el estreimiento es ms comn. Si intenta curar el estreimiento con medicamentos que producen la evacuacin de las heces (laxantes), el problema puede empeorar. El uso prolongado de laxantes puede hacer que los msculos del colon se debiliten. Una dieta baja en fibra, no tomar suficientes lquidos y el uso de ciertos medicamentos pueden empeorar el estreimiento.  CAUSAS   Ciertos medicamentos, como los antidepresivos, analgsicos, suplementos de hierro, anticidos y diurticos.  Algunas enfermedades, como la diabetes, el sndrome del colon irritable, enfermedad de la tiroides, o depresin.  No beber suficiente agua.  No consumir suficientes alimentos ricos en fibra.  Situaciones de estrs o viajes.  Falta de actividad fsica o de ejercicio.  Ignorar la necesidad sbita de defecar.  Uso en exceso de laxantes. SIGNOS Y SNTOMAS   Defecar menos de tres veces por semana.  Dificultad para defecar.  Tener las heces secas y duras, o ms grandes que las normales.  Sensacin de estar lleno o hinchado.  Dolor en la parte baja del abdomen.  No sentir alivio despus de defecar. DIAGNSTICO  El mdico le har una historia clnica y un examen fsico. Pueden hacerle exmenes adicionales para el estreimiento grave. Estos estudios pueden ser:  Un radiografa con enema de bario para examinar el recto, el colon y, en algunos casos, el intestino delgado.  Una sigmoidoscopia para examinar el colon inferior.  Una colonoscopia para examinar todo el colon. TRATAMIENTO  El tratamiento depender de la gravedad del estreimiento y de la causa. Algunos tratamientos nutricionales son beber ms lquidos y comer ms alimentos ricos en fibra. El cambio en el estilo de vida  incluye hacer ejercicios de manera regular. Si estas recomendaciones para realizar cambios en la dieta y en el estilo de vida no ayudan, el mdico le puede indicar el uso de laxantes de venta libre para ayudarlo a defecar. Los medicamentos recetados se pueden prescribir si los medicamentos de venta libre no lo ayudan.  INSTRUCCIONES PARA EL CUIDADO EN EL HOGAR   Consuma alimentos con alto contenido de fibra, como frutas, vegetales, cereales integrales y porotos.  Limite los alimentos procesados ricos en grasas y azcar, como las papas fritas, hamburguesas, galletas, dulces y refrescos.  Puede agregar un suplemento de fibra a su dieta si no obtiene lo suficiente de los alimentos.  Beba suficiente lquido para mantener la orina clara o de color amarillo plido.  Haga ejercicio regularmente o segn las indicaciones del mdico.  Vaya al bao cuando sienta la necesidad de ir. No se aguante las ganas.  Tome solo medicamentos de venta libre o recetados, segn las indicaciones del mdico. No tome otros medicamentos para el estreimiento sin consultarlo antes con su mdico. SOLICITE ATENCIN MDICA DE INMEDIATO SI:   Observa sangre brillante en las heces.  El estreimiento dura ms de 4 das o empeora.  Siente dolor abdominal o rectal.  Las heces son delgadas como un lpiz.  Pierde peso de manera inexplicable. ASEGRESE DE QUE:   Comprende estas instrucciones.  Controlar su afeccin.  Recibir ayuda de inmediato si no mejora o si empeora. Document Released: 03/01/2007 Document Revised: 02/14/2013 Exit[employer] Patient Information 2015 [employer]. This information is not intended to replace advice given to you by your health care provider. Make sure you discuss any questions you have with your health   care provider. Metamucil, Preparation H for hemorrhoids

## 2014-03-01 NOTE — Progress Notes (Signed)
Constipation and hemorrhoids. Try metamucil and prep h

## 2014-03-01 NOTE — Progress Notes (Signed)
C/o pelvic pressure.  Kristen LewandowskyAdrianna Martinez used as interpreter for this encounter.

## 2014-03-08 ENCOUNTER — Ambulatory Visit (INDEPENDENT_AMBULATORY_CARE_PROVIDER_SITE_OTHER): Payer: Self-pay | Admitting: Family Medicine

## 2014-03-08 ENCOUNTER — Other Ambulatory Visit: Payer: Self-pay | Admitting: Family Medicine

## 2014-03-08 VITALS — BP 101/56 | HR 80 | Temp 98.6°F | Wt 177.6 lb

## 2014-03-08 DIAGNOSIS — O09892 Supervision of other high risk pregnancies, second trimester: Secondary | ICD-10-CM

## 2014-03-08 DIAGNOSIS — O09212 Supervision of pregnancy with history of pre-term labor, second trimester: Secondary | ICD-10-CM

## 2014-03-08 LAB — POCT URINALYSIS DIP (DEVICE)
Bilirubin Urine: NEGATIVE
Glucose, UA: NEGATIVE mg/dL
Hgb urine dipstick: NEGATIVE
KETONES UR: NEGATIVE mg/dL
Nitrite: NEGATIVE
Protein, ur: NEGATIVE mg/dL
SPECIFIC GRAVITY, URINE: 1.02 (ref 1.005–1.030)
UROBILINOGEN UA: 0.2 mg/dL (ref 0.0–1.0)
pH: 7.5 (ref 5.0–8.0)

## 2014-03-08 LAB — OB RESULTS CONSOLE GC/CHLAMYDIA
Chlamydia: NEGATIVE
GC PROBE AMP, GENITAL: NEGATIVE

## 2014-03-08 LAB — OB RESULTS CONSOLE GBS: STREP GROUP B AG: NEGATIVE

## 2014-03-08 NOTE — Progress Notes (Signed)
Dori used for interpreter 

## 2014-03-08 NOTE — Patient Instructions (Addendum)
Tercer trimestre del embarazo  (Third Trimester of Pregnancy)  El tercer trimestre del embarazo abarca desde la semana 29 hasta la semana 42, desde el 7 mes hasta el 9. En este trimestre el beb (feto) se desarrolla muy rpidamente. Hacia el final del noveno mes, el beb que an no ha nacido mide alrededor de 20 pulgadas (45 cm) de largo. Y pesa entre 6 y 10 libras (2,700 y 4,500 kg).  CUIDADOS EN EL HOGAR   Evite fumar, consumir hierbas y beber alcohol. Evite los frmacos que no apruebe el mdico.  Slo tome los medicamentos que le haya indicado su mdico. Algunos medicamentos son seguros para tomar durante el embarazo y otros no lo son.  Haga ejercicios slo como le indique el mdico. Deje de hacer ejercicios si comienza a tener clicos.  Haga comidas regulares y sanas.  Use un sostn que le brinde buen soporte si sus mamas estn sensibles.  No utilice la baera con agua caliente, baos turcos y saunas.  Colquese el cinturn de seguridad cuando conduzca.  Evite comer carne cruda y el contacto con los utensilios y desperdicios de los gatos.  Tome las vitaminas indicadas para la etapa prenatal.  Trate de tomar medicamentos para mover el intestino (laxantes) segn lo necesario y si su mdico la autoriza. Consuma ms fibra comiendo frutas y vegetales frescos y granos enteros. Beba gran cantidad de lquido para mantener el pis (orina) de tono claro o amarillo plido.  Tome baos de agua tibia (baos de asiento) para calmar el dolor o las molestias causadas por las hemorroides. Use una crema para las hemorroides si el mdico la autoriza.  Si tiene venas hinchadas y abultadas (venas varicosas), use medias de soporte. Eleve (levante) los pies durante 15 minutos, 3 o 4 veces por da. Limite el consumo de sal en su dieta.  Evite levantar objetos pesados, usar tacones altos y sintese derecha.  Descanse con las piernas elevadas si tiene calambres o dolor de cintura.  Visite a su dentista  si no lo ha hecho durante el embarazo. Use un cepillo de dientes blando para higienizarse los dientes. Use suavemente el hilo dental.  Puede tener sexo (relaciones sexuales) siempre que el mdico la autorice.  No viaje por largas distancias si puede evitarlo. Slo hgalo con la aprobacin de su mdico.  Haga el curso pre parto.  Practique conducir hasta el hospital.  Prepare el bolso que llevar.  Prepare la habitacin del beb.  Concurra a los controles mdicos. SOLICITE AYUDA SI:   No est segura si est en trabajo de parto o ha roto la bolsa de aguas.  Tiene mareos.  Siente clicos intensos o presin en la zona baja del vientre (abdomen).  Siente un dolor persistente en la zona del vientre.  Tiene malestar estomacal (nuseas), devuelve (vomita), o tiene deposiciones acuosas (diarrea).  Advierte un olor ftido que proviene de la vagina.  Siente dolor al hacer pis (orinar). SOLICITE AYUDA DE INMEDIATO SI:   Tiene fiebre.  Pierde lquido o sangre por la vagina.  Tiene sangrando o pequeas prdidas vaginales.  Siente dolor intenso o clicos en el abdomen.  Sube o baja de peso rpidamente.  Tiene dificultad para respirar o siente dolor en el pecho.  Sbitamente se le hinchan el rostro, las manos, los tobillos, los pies o las piernas.  No ha sentido los movimientos del beb durante una hora.  Siente un dolor de cabeza intenso que no se alivia con medicamentos.  Su visin se modifica. Document   Released: 10/12/2012 Exit[employer] Patient Information 2015 [employer]. This information is not intended to replace advice given to you by your health care provider. Make sure you discuss any questions you have with your health care provider.  

## 2014-03-08 NOTE — Addendum Note (Signed)
Addended by: Levie HeritageSTINSON, JACOB J on: 03/08/2014 08:33 AM   Modules accepted: Orders

## 2014-03-08 NOTE — Progress Notes (Signed)
Good fetal movement, braxton hicks contractions.  GC/CT, GBS collected today.  Labor precautions and fetal movement.

## 2014-03-09 LAB — GC/CHLAMYDIA PROBE AMP
CT Probe RNA: NEGATIVE
GC Probe RNA: NEGATIVE

## 2014-03-10 LAB — CULTURE, BETA STREP (GROUP B ONLY)

## 2014-03-15 ENCOUNTER — Ambulatory Visit (INDEPENDENT_AMBULATORY_CARE_PROVIDER_SITE_OTHER): Payer: Self-pay | Admitting: Family Medicine

## 2014-03-15 VITALS — BP 90/71 | HR 81 | Temp 98.4°F | Wt 179.5 lb

## 2014-03-15 DIAGNOSIS — O09892 Supervision of other high risk pregnancies, second trimester: Secondary | ICD-10-CM

## 2014-03-15 DIAGNOSIS — O09212 Supervision of pregnancy with history of pre-term labor, second trimester: Secondary | ICD-10-CM

## 2014-03-15 NOTE — Progress Notes (Signed)
Patient is 34 y.o. [id number], GBS neg.  +FM, denies LOF, VB, contractions, vaginal discharge.  Overall feeling well.

## 2014-03-22 ENCOUNTER — Ambulatory Visit (INDEPENDENT_AMBULATORY_CARE_PROVIDER_SITE_OTHER): Payer: Self-pay | Admitting: Obstetrics & Gynecology

## 2014-03-22 ENCOUNTER — Encounter: Payer: Self-pay | Admitting: Obstetrics & Gynecology

## 2014-03-22 VITALS — BP 104/53 | HR 83 | Temp 97.5°F | Wt 180.5 lb

## 2014-03-22 DIAGNOSIS — O09892 Supervision of other high risk pregnancies, second trimester: Secondary | ICD-10-CM

## 2014-03-22 DIAGNOSIS — O09212 Supervision of pregnancy with history of pre-term labor, second trimester: Secondary | ICD-10-CM

## 2014-03-22 NOTE — Progress Notes (Signed)
No complaints.  Needs to pick pediatrician.  UA not back.  RN will chart result when it returns.

## 2014-03-22 NOTE — Progress Notes (Signed)
Dori Dalene CarrowArias Research officer, trade unionpanish Interpreter for The Tim[employer] CompanyEncounter

## 2014-03-26 NOTE — Progress Notes (Signed)
Patient did not leave urine specimen.

## 2014-03-29 ENCOUNTER — Ambulatory Visit (INDEPENDENT_AMBULATORY_CARE_PROVIDER_SITE_OTHER): Payer: Self-pay | Admitting: Obstetrics & Gynecology

## 2014-03-29 VITALS — BP 101/54 | HR 75 | Temp 98.2°F | Wt 181.8 lb

## 2014-03-29 DIAGNOSIS — O09213 Supervision of pregnancy with history of pre-term labor, third trimester: Secondary | ICD-10-CM

## 2014-03-29 DIAGNOSIS — O09893 Supervision of other high risk pregnancies, third trimester: Secondary | ICD-10-CM

## 2014-03-29 LAB — POCT URINALYSIS DIP (DEVICE)
BILIRUBIN URINE: NEGATIVE
Glucose, UA: NEGATIVE mg/dL
Hgb urine dipstick: NEGATIVE
Ketones, ur: NEGATIVE mg/dL
NITRITE: NEGATIVE
PH: 8.5 — AB (ref 5.0–8.0)
PROTEIN: NEGATIVE mg/dL
SPECIFIC GRAVITY, URINE: 1.015 (ref 1.005–1.030)
Urobilinogen, UA: 0.2 mg/dL (ref 0.0–1.0)

## 2014-03-29 NOTE — Patient Instructions (Signed)
Parto vaginal (Vaginal Delivery) Durante el parto, el mdico la ayudar a dar a luz a su beb. En elparto vaginal, deber pujar para que el beb salga por la vagina. Sin embargo, antes de que pueda sacar al beb, es necesario que ocurran ciertas cosas. La abertura del tero (cuello del tero) tiene que ablandarse, hacerse ms delgado y abrirse (dilatar) hasta que llegue a 10 cm. Adems, el beb tiene que bajar desde el tero a la vagina. SIGNOS DE TRABAJO DE PARTO  El mdico tendr primero que asegurarse de que usted est en trabajo de parto. Algunos signos son:   Eliminar lo que se llama tapn mucoso antes del inicio del trabajo de parto. Este es una pequea cantidad de mucosidad teida con sangre.  Tener contracciones uterinas regulares y dolorosas.   El tiempo entre las contracciones debe acortarse  Las molestias y el dolor se harn ms intensos gradualmente.  El dolor de las contracciones empeora al caminar y no se alivia con el reposo.   El cuello del tero se hace mas delgado (se borra) y se dilata. ANTES DEL PARTO Una vez que se inicie el trabajo de parto y sea admitida en el hospital o sanatorio, el mdico podr hacer lo siguiente:   Realizar un examen fsico.  Controlar si hay complicaciones relacionadas con el trabajo de parto.  Verificar su presin arterial, temperatura y pulso y la frecuencia cardaca (signos vitales).   Determinar si se ha roto el saco amnitico y cundo ha ocurrido.  Realizar un examen vaginal (utilizando un guante estril y un lubricante) para determinar:  La posicin (presentacin) del beb. El beb se presenta con la cabeza primero (vertex) en el canal de parto (vagina), o estn los pies o las nalgas primero (de nalgas)?  El nivel (estacin) de la cabeza del beb dentro del canal de parto.  El borramiento y la dilatacin del cuello uterino  El monitor fetal electrnico generalmente se coloca sobre el abdomen al llegar. Se utiliza para  controlar las contracciones y la frecuencia cardaca del beb.  Cuando el monitor est en el abdomen (monitor fetal externo), slo toma la frecuencia y la duracin de las contracciones. No informa acerca de la intensidad de las contracciones.  Si el mdico necesita saber exactamente la intensidad de las contracciones o cul es la frecuencia cardaca del beb, colocar un monitor interno en la vagina y el tero. El mdico comentar los riesgos y los beneficios de usar un monitor interno y le pedir autorizacin antes de colocar el dispositivo.  El monitoreo fetal continuo ser necesario si le han aplicado una epidural, si le administran ciertos medicamentos (como oxitocina) y si tiene complicaciones del embarazo o del trabajo de parto.  Podrn colocarle una va intravenosa en una vena del brazo para suministrarle lquidos y medicamentos, si es necesario. TRES ETAPAS DEL TRABAJO DE PARTO Y EL PARTO El trabajo de parto y el parto normales se dividen en tres etapas. Primera etapa Esta etapa comienza cuando comienzan las contracciones regulares y el cuello comienza a borrarse y dilatarse. Finaliza cuando el cuello est completamente abierto (completamente dilatado). La primera etapa es la etapa ms larga del trabajo de parto y puede durar desde 3 horas a 15 horas.  Algunos mtodos estn disponibles para ayudar con el dolor del parto. Usted y su mdico decidirn qu opcin es la mejor para usted. Las opciones incluyen:   Medicamentos narcticos. Estos son medicamentos fuertes que usted puede recibir a travs de una va intravenosa o   como inyeccin en el msculo. Estos medicamentos alivian el dolor pero no hacen que desaparezca completamente.  Epidural. Se administra un medicamento a travs de un tubo delgado que se inserta en la espalda. El medicamento adormece la parte inferior del cuerpo y evita el dolor en esa zona.  Bloqueo paracervical Es una inyeccin de un anestsico en cada lado del cuello  uterino.  Usted podr pedir un parto natural, que implica que no se usen analgsicos ni epidural durante el parto y el trabajo de parto. En cambio, podr tener otro tipo de ayuda como ejercicios respiratorios para hacer frente al dolor. Segunda etapa La segunda etapa del trabajo de parto comienza cuando el cuello se ha dilatado completamente a 10 cm. Contina hasta que usted puja al beb hacia abajo, por el canal de parto, y el beb nace. Esta etapa puede durar slo algunos minutos o algunas horas.  La posicin del la cabeza del beb a medida que pasa por el canal de parto, es informada como un nmero, llamado estacin. Si la cabeza del beb no ha iniciado su descenso, la estacin se describe como que est en menos 3 (-3). Cuando la cabeza del beb est en la estacin cero, est en el medio del canal de parto y se encaja en la pelvis. La estacin en la que se encuentra el beb indica el progreso de la segunda etapa del trabajo de parto.  Cuando el beb nace, el mdico lo sostendr con la cabeza hacia abajo para evitar que el lquido amnitico, el moco y la sangre entren en los pulmones del beb. La boca y la nariz del beb podrn ser succionadas con un pequeo bulbo para retirar todo lquido adicional.  El mdico podr colocar al beb sobre su estmago. Es importante evitar que el beb tome fro. Para hacerlo, el mdico secar al beb, lo colocar directamente sobre su piel, (sin mantas entre usted y el beb) y lo cubrir con mantas secas y tibias.  Se corta el cordn umbilical. Tercera etapa Durante la tercera etapa del trabajo de parto, el mdico sacar la placenta (alumbramiento) y se asegurar de que el sangrado est controlado. La salida de la placenta generalmente demora 5 minutos pero puede tardar hasta 30 minutos. Luego de la salida de la placenta, le darn un medicamento por va intravenosa o inyectable para ayudar a contraer el tero y controlar el sangrado. Si planea amamantar al beb,  puede intentar en este momento Luego de la salida de la placenta, el tero debe contraerse y quedar muy firme. Si el tero no queda firme, el mdico lo masajear. Esto es importante debido a que la contraccin del tero ayuda a cortar el sangrado en el sitio en que la placenta estaba unida al tero. Si el tero no se contrae adecuadamente ni permanece firme, podr causar un sangrado abundante. Si hay mucho sangrado, podrn darle medicamentos para contraer el tero y detener el sangrado.  Document Released: 01/23/2008 Document Revised: 06/26/2013 ExitCare Patient Information 2015 [employer]. This information is not intended to replace advice given to you by your health care provider. Make sure you discuss any questions you have with your health care provider.  

## 2014-03-29 NOTE — Progress Notes (Signed)
Labor precautions given. Few B[facility].

## 2014-03-29 NOTE — Progress Notes (Signed)
Used Interpreter Abel PrestoAdriana Zavala- Bradly BienenstockMartinez

## 2014-04-03 ENCOUNTER — Encounter (HOSPITAL_COMMUNITY): Payer: Self-pay | Admitting: *Deleted

## 2014-04-03 ENCOUNTER — Inpatient Hospital Stay (HOSPITAL_COMMUNITY): Payer: Medicaid Other | Admitting: Anesthesiology

## 2014-04-03 ENCOUNTER — Inpatient Hospital Stay (HOSPITAL_COMMUNITY)
Admission: AD | Admit: 2014-04-03 | Discharge: 2014-04-04 | DRG: 775 | Disposition: A | Discharge status: Home / Self Care | Payer: Medicaid Other | Source: Ambulatory Visit | Attending: Family Medicine | Admitting: Family Medicine

## 2014-04-03 DIAGNOSIS — Z3A01 Less than 8 weeks gestation of pregnancy: Secondary | ICD-10-CM

## 2014-04-03 DIAGNOSIS — IMO0001 Reserved for inherently not codable concepts without codable children: Secondary | ICD-10-CM

## 2014-04-03 DIAGNOSIS — O99214 Obesity complicating childbirth: Secondary | ICD-10-CM | POA: Diagnosis present

## 2014-04-03 DIAGNOSIS — O471 False labor at or after 37 completed weeks of gestation: Secondary | ICD-10-CM | POA: Diagnosis present

## 2014-04-03 DIAGNOSIS — Z6834 Body mass index (BMI) 34.0-34.9, adult: Secondary | ICD-10-CM

## 2014-04-03 DIAGNOSIS — Z3A39 39 weeks gestation of pregnancy: Secondary | ICD-10-CM | POA: Diagnosis present

## 2014-04-03 HISTORY — DX: Unspecified hemorrhoids: K64.9

## 2014-04-03 LAB — CBC
HEMATOCRIT: 41.1 % (ref 36.0–46.0)
Hemoglobin: 13.6 g/dL (ref 12.0–15.0)
MCH: 26.8 pg (ref 26.0–34.0)
MCHC: 33.1 g/dL (ref 30.0–36.0)
MCV: 80.9 fL (ref 78.0–100.0)
PLATELETS: 149 10*3/uL — AB (ref 150–400)
RBC: 5.08 MIL/uL (ref 3.87–5.11)
RDW: 14.3 % (ref 11.5–15.5)
WBC: 8.9 10*3/uL (ref 4.0–10.5)

## 2014-04-03 LAB — TYPE AND SCREEN
ABO/RH(D): AB POS
Antibody Screen: NEGATIVE

## 2014-04-03 LAB — ABO/RH: ABO/RH(D): AB POS

## 2014-04-03 MED ORDER — DIPHENHYDRAMINE HCL 25 MG PO CAPS: 25.0000 mg | ORAL_CAPSULE | Freq: Four times a day (QID) | ORAL | Status: DC | PRN

## 2014-04-03 MED ORDER — BENZOCAINE-MENTHOL 20-0.5 % EX AERO
1.0000 "application " | INHALATION_SPRAY | CUTANEOUS | Status: DC | PRN
  Filled 2014-04-03: qty 56

## 2014-04-03 MED ORDER — ONDANSETRON HCL 4 MG/2ML IJ SOLN: 4.0000 mg | Freq: Four times a day (QID) | INTRAMUSCULAR | Status: DC | PRN

## 2014-04-03 MED ORDER — ONDANSETRON HCL 4 MG/2ML IJ SOLN: 4.0000 mg | INTRAMUSCULAR | Status: DC | PRN

## 2014-04-03 MED ORDER — LANOLIN HYDROUS EX OINT: TOPICAL_OINTMENT | CUTANEOUS | Status: DC | PRN

## 2014-04-03 MED ORDER — OXYCODONE-ACETAMINOPHEN 5-325 MG PO TABS: 2.0000 | ORAL_TABLET | ORAL | Status: DC | PRN

## 2014-04-03 MED ORDER — SIMETHICONE 80 MG PO CHEW: 80.0000 mg | CHEWABLE_TABLET | ORAL | Status: DC | PRN

## 2014-04-03 MED ORDER — PHENYLEPHRINE 40 MCG/ML (10ML) SYRINGE FOR IV PUSH (FOR BLOOD PRESSURE SUPPORT)
80.0000 ug | PREFILLED_SYRINGE | INTRAVENOUS | Status: DC | PRN
  Filled 2014-04-03: qty 2

## 2014-04-03 MED ORDER — EPHEDRINE 5 MG/ML INJ
INTRAVENOUS | Status: AC
  Filled 2014-04-03: qty 4

## 2014-04-03 MED ORDER — LACTATED RINGERS IV SOLN
INTRAVENOUS | Status: DC
  Administered 2014-04-03 (×2): via INTRAVENOUS

## 2014-04-03 MED ORDER — PHENYLEPHRINE 40 MCG/ML (10ML) SYRINGE FOR IV PUSH (FOR BLOOD PRESSURE SUPPORT)
PREFILLED_SYRINGE | INTRAVENOUS | Status: AC
  Filled 2014-04-03: qty 10

## 2014-04-03 MED ORDER — OXYCODONE-ACETAMINOPHEN 5-325 MG PO TABS: 1.0000 | ORAL_TABLET | ORAL | Status: DC | PRN

## 2014-04-03 MED ORDER — LIDOCAINE HCL (PF) 1 % IJ SOLN
INTRAMUSCULAR | Status: DC | PRN
  Administered 2014-04-03 (×2): 4 mL

## 2014-04-03 MED ORDER — LACTATED RINGERS IV SOLN: 500.0000 mL | INTRAVENOUS | Status: DC | PRN

## 2014-04-03 MED ORDER — BUTORPHANOL TARTRATE 1 MG/ML IJ SOLN
1.0000 mg | INTRAMUSCULAR | Status: DC | PRN
  Administered 2014-04-03: 1 mg via INTRAVENOUS
  Filled 2014-04-03: qty 1

## 2014-04-03 MED ORDER — CITRIC ACID-SODIUM CITRATE 334-500 MG/5ML PO SOLN: 30.0000 mL | ORAL | Status: DC | PRN

## 2014-04-03 MED ORDER — EPHEDRINE 5 MG/ML INJ
10.0000 mg | INTRAVENOUS | Status: DC | PRN
  Filled 2014-04-03: qty 2

## 2014-04-03 MED ORDER — DIBUCAINE 1 % RE OINT: 1.0000 "application " | TOPICAL_OINTMENT | RECTAL | Status: DC | PRN

## 2014-04-03 MED ORDER — PRENATAL MULTIVITAMIN CH
1.0000 | ORAL_TABLET | Freq: Every day | ORAL | Status: DC
  Administered 2014-04-04: 1 via ORAL
  Filled 2014-04-03: qty 1

## 2014-04-03 MED ORDER — LIDOCAINE HCL (PF) 1 % IJ SOLN
30.0000 mL | INTRAMUSCULAR | Status: DC | PRN
  Filled 2014-04-03: qty 30

## 2014-04-03 MED ORDER — OXYTOCIN 40 UNITS IN LACTATED RINGERS INFUSION - SIMPLE MED
62.5000 mL/h | INTRAVENOUS | Status: DC
Start: 2014-04-03 — End: 2014-04-03
  Filled 2014-04-03: qty 1000

## 2014-04-03 MED ORDER — SENNOSIDES-DOCUSATE SODIUM 8.6-50 MG PO TABS
2.0000 | ORAL_TABLET | ORAL | Status: DC
  Administered 2014-04-03: 2 via ORAL
  Filled 2014-04-03: qty 2

## 2014-04-03 MED ORDER — OXYTOCIN BOLUS FROM INFUSION
500.0000 mL | INTRAVENOUS | Status: DC
Start: 2014-04-03 — End: 2014-04-03
  Administered 2014-04-03: 500 mL via INTRAVENOUS

## 2014-04-03 MED ORDER — ACETAMINOPHEN 325 MG PO TABS: 650.0000 mg | ORAL_TABLET | ORAL | Status: DC | PRN

## 2014-04-03 MED ORDER — ONDANSETRON HCL 4 MG PO TABS: 4.0000 mg | ORAL_TABLET | ORAL | Status: DC | PRN

## 2014-04-03 MED ORDER — IBUPROFEN 600 MG PO TABS
600.0000 mg | ORAL_TABLET | Freq: Four times a day (QID) | ORAL | Status: DC
  Administered 2014-04-03 – 2014-04-04 (×3): 600 mg via ORAL
  Filled 2014-04-03 (×5): qty 1

## 2014-04-03 MED ORDER — ZOLPIDEM TARTRATE 5 MG PO TABS: 5.0000 mg | ORAL_TABLET | Freq: Every evening | ORAL | Status: DC | PRN

## 2014-04-03 MED ORDER — FENTANYL 2.5 MCG/ML BUPIVACAINE 1/10 % EPIDURAL INFUSION (WH - ANES)
14.0000 mL/h | INTRAMUSCULAR | Status: DC | PRN
  Administered 2014-04-03: 10:00:00 via EPIDURAL

## 2014-04-03 MED ORDER — LACTATED RINGERS IV SOLN: 500.0000 mL | Freq: Once | INTRAVENOUS | Status: DC

## 2014-04-03 MED ORDER — EPHEDRINE 5 MG/ML INJ
10.0000 mg | INTRAVENOUS | Status: DC | PRN
Start: 2014-04-03 — End: 2014-04-03
  Administered 2014-04-03: 10 mg via INTRAVENOUS
  Filled 2014-04-03: qty 2

## 2014-04-03 MED ORDER — FENTANYL 2.5 MCG/ML BUPIVACAINE 1/10 % EPIDURAL INFUSION (WH - ANES)
INTRAMUSCULAR | Status: DC | PRN
  Administered 2014-04-03: 14 mL/h via EPIDURAL

## 2014-04-03 MED ORDER — WITCH HAZEL-GLYCERIN EX PADS: 1.0000 "application " | MEDICATED_PAD | CUTANEOUS | Status: DC | PRN

## 2014-04-03 MED ORDER — OXYCODONE-ACETAMINOPHEN 5-325 MG PO TABS
2.0000 | ORAL_TABLET | ORAL | Status: DC | PRN
  Administered 2014-04-04: 2 via ORAL
  Filled 2014-04-03: qty 2

## 2014-04-03 MED ORDER — TETANUS-DIPHTH-ACELL PERTUSSIS 5-2.5-18.5 LF-MCG/0.5 IM SUSP: 0.5000 mL | Freq: Once | INTRAMUSCULAR | Status: DC

## 2014-04-03 MED ORDER — DIPHENHYDRAMINE HCL 50 MG/ML IJ SOLN: 12.5000 mg | INTRAMUSCULAR | Status: DC | PRN

## 2014-04-03 MED ORDER — FENTANYL 2.5 MCG/ML BUPIVACAINE 1/10 % EPIDURAL INFUSION (WH - ANES)
INTRAMUSCULAR | Status: AC
  Filled 2014-04-03: qty 125

## 2014-04-03 NOTE — Progress Notes (Signed)
I assisted Deven RN with some initial teaching and instructions for mom and baby during  Plan of care, I also ordered her meals, by Orlan LeavensViria Alvarez Spanish Interpreter

## 2014-04-03 NOTE — Anesthesia Preprocedure Evaluation (Signed)
Anesthesia Evaluation  Patient identified by MRN, date of birth, ID band Patient awake    Reviewed: Allergy & Precautions, Patient's Chart, lab work & pertinent test results  Airway Mallampati: III  TM Distance: >3 FB Neck ROM: Full    Dental no notable dental hx. (+) Teeth Intact   Pulmonary neg pulmonary ROS,  breath sounds clear to auscultation  Pulmonary exam normal       Cardiovascular negative cardio ROS  Rhythm:Regular Rate:Normal     Neuro/Psych  Headaches, negative psych ROS   GI/Hepatic negative GI ROS, Neg liver ROS,   Endo/Other  Obesity  Renal/GU negative Renal ROS  negative genitourinary   Musculoskeletal negative musculoskeletal ROS (+)   Abdominal (+) + obese,   Peds  Hematology negative hematology ROS (+)   Anesthesia Other Findings   Reproductive/Obstetrics (+) Pregnancy                             Anesthesia Physical Anesthesia Plan  ASA: II  Anesthesia Plan: Epidural   Post-op Pain Management:    Induction:   Airway Management Planned: Natural Airway  Additional Equipment:   Intra-op Plan:   Post-operative Plan:   Informed Consent: I have reviewed the patients History and Physical, chart, labs and discussed the procedure including the risks, benefits and alternatives for the proposed anesthesia with the patient or authorized representative who has indicated his/her understanding and acceptance.     Plan Discussed with: Anesthesiologist  Anesthesia Plan Comments:         Anesthesia Quick Evaluation

## 2014-04-03 NOTE — Progress Notes (Signed)
I assisted RN Dyke MaesJana from Lactation with some questions and teaching about breast feeding also, by Orlan LeavensViria Alvarez Spanish Interpreter

## 2014-04-03 NOTE — Anesthesia Procedure Notes (Addendum)
Epidural Patient location during procedure: OB Start time: 04/03/2014 10:01 AM  Staffing Anesthesiologist: Aquan Kope A. Performed by: anesthesiologist   Preanesthetic Checklist Completed: patient identified, site marked, surgical consent, pre-op evaluation, timeout performed, IV checked, risks and benefits discussed and monitors and equipment checked  Epidural Patient position: sitting Prep: site prepped and draped and DuraPrep Patient monitoring: continuous pulse ox and blood pressure Approach: midline Location: L3-L4 Injection technique: LOR air  Needle:  Needle type: Tuohy  Needle gauge: 17 G Needle length: 9 cm and 9 Needle insertion depth: 6 cm Catheter type: closed end flexible Catheter size: 19 Gauge Catheter at skin depth: 11 cm Test dose: negative and Other  Assessment Events: blood not aspirated, injection not painful, no injection resistance, negative IV test and no paresthesia  Additional Notes Patient identified. Risks and benefits discussed including failed block, incomplete  Pain control, post dural puncture headache, nerve damage, paralysis, blood pressure Changes, nausea, vomiting, reactions to medications-both toxic and allergic and post Partum back pain. All questions were answered. Patient expressed understanding and wished to proceed. Sterile technique was used throughout procedure. Epidural site was Dressed with sterile barrier dressing. No paresthesias, signs of intravascular injection Or signs of intrathecal spread were encountered.  Patient was more comfortable after the epidural was dosed. Please see RN's note for documentation of vital signs and FHR which are stable.

## 2014-04-03 NOTE — Progress Notes (Signed)
Ivin PootBlanca D Serrano Revuelta is a 34 y.o. [phone] [id number] at 4859w6d admitted for active labor  Subjective: Patient feeling well with no discomfort at this time. BP had been low for about 10 minutes but now improving. Mild dizziness.  Objective: BP 119/60 mmHg  Pulse 81  Temp(Src) 97.9 F (36.6 C) (Oral)  Resp 20  Ht 5' 1.5" (1.562 m)  Wt 83.462 kg (184 lb)  BMI 34.21 kg/m2  SpO2 94%  LMP 06/28/2013     FHT:  FHR: 130 bpm, variability: moderate,  accelerations:  Present,  decelerations:  Present variables x 2 UC:   regular, every 2-3 minutes SVE:   Dilation: 7 Effacement (%): 80 Station: -1 Exam by:: Arnie Clingenpeel  Labs: Lab Results  Component Value Date   WBC 8.9 04/03/2014   HGB 13.6 04/03/2014   HCT 41.1 04/03/2014   MCV 80.9 04/03/2014   PLT 149* 04/03/2014    Assessment / Plan: Spontaneous labor, progressing normally H/o preterm infant at 32w with IUGR and oligohydramnios. Referred this pregnancy to [facility]. However did not start 17p injections as prior IOL for IUGR and oligohydramnios.  Labor: Progressing normally and AROM clear after bloody show at 10:45AM Preeclampsia:  no signs or symptoms of toxicity Fetal Wellbeing:  Category II Pain Control:  Epidural I/D:  n/a Anticipated MOD:  NSVD  [facility] Contraception: Nexplanon; Feeding preference: Breast-bottle; Circumcision: Declined  Simone Curiahekkekandam, Benjamim Harnish 04/03/2014, 10:55 AM

## 2014-04-03 NOTE — H&P (Signed)
Kristen Davila is a 34 y.o. female [phone] presenting for onset of labor. She reports regular contractions every 3-5 minutes starting at 1 am this morning with increasing intensity. She does not think her membranes have ruptured but she does report some scant vaginal bleeding starting one hour ago. She received her prenatal care at the [facility] clinics and reports no fetal complications. She delivered three other infants vaginally with no complications. She has a history of preterm infant delivered at 32w with IUGR and oligohydramnios. She is requesting an epidural.  Maternal Medical History:  Reason for admission: Contractions.   Contractions: Onset was 6-12 hours ago.   Frequency: regular.   Duration is approximately 4 minutes.   Perceived severity is moderate.    Prenatal complications: No PIH, infection, pre-eclampsia or substance abuse.   Prenatal Complications - Diabetes: none.    OB History    Gravida Para Term Preterm AB TAB SAB Ectopic Multiple Living   Past Medical History  Diagnosis Date  . Varicose vein   . Headache(784.0)   . Constipation    History reviewed. No pertinent past surgical history. Family History: family history is not on file. Social History:  reports that she has never smoked. She has never used smokeless tobacco. She reports that she does not drink alcohol or use illicit drugs.   Prenatal Transfer Tool  Maternal Diabetes: No Genetic Screening: Declined Maternal Ultrasounds/Referrals: Normal Fetal Ultrasounds or other Referrals:  None Maternal Substance Abuse:  No Significant Maternal Medications:  None Significant Maternal Lab Results:  Lab values include: Group B Strep negative Other Comments:  None  ROS  Dilation: 7 Effacement (%): 70 Station: -2 Exam by:: S. Carrera, RNC Blood pressure 89/45, pulse 75, temperature 97.9 F (36.6 C), temperature source Oral, resp. rate 20, height 5' 1.5" (1.562 m),  weight 184 lb (83.462 kg), last menstrual period 06/28/2013, SpO2 94 %. Maternal Exam:  Uterine Assessment: Contraction strength is mild.  Contraction duration is 4 minutes. Contraction frequency is regular.   Abdomen: Patient reports no abdominal tenderness.   Physical Exam  Constitutional: She is oriented to person, place, and time. She appears well-developed and well-nourished.  Cardiovascular: Normal rate and regular rhythm.   Respiratory: Effort normal and breath sounds normal.  GI: Soft. Bowel sounds are normal.  Neurological: She is alert and oriented to person, place, and time.    Prenatal labs: ABO, Rh: --/--/AB POS (02/09 0848) Antibody: NEG (02/09 0848) Rubella: >33.00 (08/05 0953) RPR: NON REAC (11/24 1552)  HBsAg: NEGATIVE (08/05 0953)  HIV: NONREACTIVE (11/24 1552)  GBS: Negative (01/14 0000)    [facility]inic [facility] Prenatal Labs  Dating LMP Blood type: AB/POS/-- (08/05 0953)  Genetic Screen Declined Antibody:NEG (08/05 3086)  Anatomic [place]  normal but incomplete- Pt declined follow up due to cost Rubella: >33.00 (08/05 0953)  GTT Third trimester: 128 RPR: NON REAC (11/24 1552)   TDaP vaccine 01/16/14 HBsAg: NEGATIVE (08/05 0953)   Flu vaccine 11/16/13 HIV: NONREACTIVE (11/24 1552)   GBS neg VHQ:IONGEXBM (01/14 0000)  Contraception  Nexplanon Pap: normal  Baby Food  breast and bo   Circumcision  no   [facility]ian [facility]   Support Person FOB and mother   (Initial prenatal labs (CBC, HIV, RPR, Hep B, UA/culture) negative H/o preterm delivery 33 weeks, induced because of oligo and IUGR. Subsequent pregnancy full term with 17p injections. No  indication  to use 17 p no h/o PTL)   Assessment/Plan: 1. Onset of labor - expectant management 2. Pain management - epidural  3. Admit to birthing suite 4. MOC: nexpl, MOF: br-bo  Bettyjean Stefanski ROCIO 04/03/2014, 10:34 AM

## 2014-04-03 NOTE — Lactation Note (Addendum)
This note was copied from the chart of Boy Theodis BlazeBlanca Serrano Revuelta. Lactation Consultation Note Initial visit at 8 hours of age.  Spanish interpreter at bedside.   Mom reports baby has been breastfeeding a lot and she has a little pain.  Nipples are erect and appear WNL.  Encouraged to express colostrum and rub into nipples.  Mom is able to return demonstration with large amount of colostrum noted.  Baby is not asleep swaddled in her arms.  [facility] resources given and discussed.  Encouraged to feed with early cues on demand.  Early newborn behavior discussed. Discussed cluster feeding and supply.  Encouraged [facility] when she has to return to work.  MBU RN reports seeing latch at shift change and reports latch appeared deep.  Discussed pillow support and to call for assist with positioning baby.   Mom to call for assist as needed.      Patient Name: Boy Theodis BlazeBlanca Serrano Revuelta ZOXWR'UToday's Date: 04/03/2014 Reason for consult: Initial assessment;Other (Comment) (interpreter assisted)   Maternal Data Has patient been taught Hand Expression?: Yes Does the patient have breastfeeding experience prior to this delivery?: Yes  Feeding Feeding Type: Breast Fed Length of feed: 15 min  LATCH Score/Interventions                      Lactation Tools Discussed/Used     Consult Status Consult Status: Follow-up Date: 04/04/14 Follow-up type: In-patient    Jannifer RodneyShoptaw, Jana Lynn 04/03/2014, 8:47 PM

## 2014-04-03 NOTE — Progress Notes (Signed)
I assisted [name], RN with explanation of care plan.  [name]  Interpreter.

## 2014-04-04 ENCOUNTER — Telehealth: Payer: Self-pay | Admitting: Family Medicine

## 2014-04-04 DIAGNOSIS — M79609 Pain in unspecified limb: Secondary | ICD-10-CM

## 2014-04-04 DIAGNOSIS — M7989 Other specified soft tissue disorders: Secondary | ICD-10-CM

## 2014-04-04 LAB — HIV ANTIBODY (ROUTINE TESTING W REFLEX): HIV SCREEN 4TH GENERATION: NONREACTIVE

## 2014-04-04 LAB — RPR: RPR: NONREACTIVE

## 2014-04-04 NOTE — Discharge Instructions (Signed)

## 2014-04-04 NOTE — Progress Notes (Signed)
I stopped by to check on patient's needs.  [name] Interpreter. °

## 2014-04-04 NOTE — Progress Notes (Signed)
I assisted Hospital doctorJen RN wit some discharges instructions to go home, by [employer] American PipelineViria Alvarez Spanish Interpreter

## 2014-04-04 NOTE — Discharge Summary (Signed)
Obstetric Discharge Summary Reason for Admission: onset of labor Prenatal Procedures: NST and ultrasound Intrapartum Procedures: spontaneous vaginal delivery Postpartum Procedures: none Complications-Operative and Postpartum: none   Patient is a [id number] who was at 2052w6d. Plans to breast and bottle feed. OP Nexplanon desired.   HEMOGLOBIN  Date Value Ref Range Status  04/03/2014 13.6 12.0 - 15.0 g/dL Final   HCT  Date Value Ref Range Status  04/03/2014 41.1 36.0 - 46.0 % Final    Physical Exam:  General: alert, cooperative, appears stated age and no distress Lochia: appropriate Uterine Fundus: firm Incision: n/a DVT Evaluation: No evidence of DVT seen on physical exam. No cords or calf tenderness. No significant calf/ankle edema.  Discharge Diagnoses: Term Pregnancy-delivered  Discharge Information: Date: 04/04/2014 Activity: pelvic rest Diet: routine Medications: PNV Condition: stable Instructions: refer to practice specific booklet Discharge to: home Follow-up Information    [facility] Schedule an appointment as soon as possible for a visit in 6 weeks.   Specialty:  Obstetrics and Gynecology   Contact information:   [address] [place] [id number] [phone]      Newborn Data: Live born female  Birth Weight: 6 lb 8 oz (2948 g) APGAR: 9, 9  Home with mother.  Kathee DeltonMcKeag, Ian D 04/04/2014, 3:16 PM   Addendum: LE duplex done due to mild LLE swelling and calf tenderness>>negative.  Re: contraception, pt interested in nexplanon but orange card no longer covers this as they did with her first pregnancy. Will attempt to call and let her know options (see phone note today from Jazmin Hartsell).  Discussed reasons for follow up sooner than 6 weeks (fever, bleeding, abnormal discharge, chest pain, dyspnea). Otherwise [facility]. Pt stated [facility].  Leona SingletonMaria T Donyel Castagnola, MD PGY-3,  [facility]

## 2014-04-04 NOTE — Anesthesia Postprocedure Evaluation (Signed)
Anesthesia Post Note  Patient: Kristen Davila  Procedure(s) Performed: * No procedures listed *  Anesthesia type: Epidural  Patient location: Mother/Baby  Post pain: Pain level controlled  Post assessment: Post-op Vital signs reviewed  Last Vitals:  Filed Vitals:   04/04/14 0416  BP: 91/57  Pulse: 72  Temp: 36.4 C  Resp: 20    Post vital signs: Reviewed  Level of consciousness:alert  Complications: No apparent anesthesia complications Anesthesia Post Note  Patient: Kristen Davila  Procedure(s) Performed: * No procedures listed *  Anesthesia type: Epidural  Patient location: Mother/Baby  Post pain: Pain level controlled  Post assessment: Post-op Vital signs reviewed  Last Vitals:  Filed Vitals:   04/04/14 0416  BP: 91/57  Pulse: 72  Temp: 36.4 C  Resp: 20    Post vital signs: Reviewed  Level of consciousness:alert  Complications: No apparent anesthesia complications

## 2014-04-04 NOTE — Progress Notes (Signed)
VASCULAR LAB PRELIMINARY  PRELIMINARY  PRELIMINARY  PRELIMINARY  Left lower extremity venous duplex completed.    Preliminary report:  Left:  No evidence of DVT, superficial thrombosis, or Baker's cyst.  Kristen Davila, RVS 04/04/2014, 10:49 AM

## 2014-04-04 NOTE — Progress Notes (Signed)
UR chart review completed.  

## 2014-04-04 NOTE — Telephone Encounter (Signed)
Spoke with Shanda BumpsJessica and OCPs are cheap OOP.  GCCN doesn't cover any type of birth control.  If patient wants nexplanon she thinks it is about $1000 cash up front.  Also we could offer depo but it would still be about $50 cash up front for each injection. Breea Loncar,CMA

## 2014-04-04 NOTE — Telephone Encounter (Signed)
Blue Team, patient would like Nexplanon. She has [employer] so I told her I do not believe that covers nexplanon and it could be very costly. Could you please tell me what birth control options Orange Card covers so I can let her know. I told her I thought it may only cover OCPs but that I would ask.  Thanks,  Leona SingletonMaria T Penni Penado, MD

## 2014-04-04 NOTE — Progress Notes (Signed)
Post Partum Day 1 Subjective: no complaints, up ad lib, voiding, tolerating PO and + flatus  Objective: Blood pressure 91/57, pulse 72, temperature 97.5 F (36.4 C), temperature source Oral, resp. rate 20, height 5' 1.5" (1.562 m), weight 184 lb (83.462 kg), last menstrual period 06/28/2013, SpO2 94 %, unknown if currently breastfeeding.  Physical Exam:  General: alert, cooperative, appears stated age and no distress Lochia: appropriate - more than period Uterine Fundus: firm Incision: N/A DVT Evaluation: Negative homan's sign but mild left calf tenderness, mild left ankle fullness compared to right, and small bluish varicosity CV: RRR, no m/r/g PULM: CTAB, normal effort    Recent Labs  04/03/14 0848  HGB 13.6  HCT 41.1    Assessment/Plan: Breastfeeding, Lactation consult and Contraception desires nexplanon but may not get covered with [employer] Card - will discuss with Family Practice where she wants to f/u and [facility] program.  Left LE mild swelling and tenderness - Ordering LE duplex left  Plan for discharge today if duplex negative  Discussed follow up in 6 weeks, abstinence for 6 weeks, and walking to prevent clot.   LOS: 1 day   Simone Curiahekkekandam, Evangelene Vora 04/04/2014, 9:26 AM

## 2014-04-05 ENCOUNTER — Encounter: Payer: Self-pay | Admitting: Family

## 2014-04-10 NOTE — Telephone Encounter (Signed)
Attempted to call patient to discuss this but phone just rang and rang (both numbers). Could we get Graciela or another interpreter to attempt to call and discuss with patient to see if she wants me to send in OCP or if she wants to call Health Department to see if they have other options (cheaper nexplanon or IUD)? Thank you, Leona SingletonMaria T Nickoli Bagheri, MD

## 2014-04-11 NOTE — Telephone Encounter (Signed)
Advised pt as below of birth control options using [address] [place], [place] [id number]) and pt wanted to wait to discuss options with doctor on 05/14/14. Cyndie MullLatoya Marae Cottrell, CMA.

## 2014-05-14 ENCOUNTER — Ambulatory Visit: Payer: Self-pay | Admitting: Family Medicine

## 2014-05-14 ENCOUNTER — Ambulatory Visit (INDEPENDENT_AMBULATORY_CARE_PROVIDER_SITE_OTHER): Payer: Self-pay | Admitting: Family Medicine

## 2014-05-14 VITALS — BP 96/61 | HR 77 | Wt 157.9 lb

## 2014-05-14 DIAGNOSIS — K5901 Slow transit constipation: Secondary | ICD-10-CM

## 2014-05-14 MED ORDER — LACTULOSE 10 GM/15ML PO SOLN
20.0000 g | Freq: Every day | ORAL | Status: AC | PRN
Start: 2014-05-14 — End: ?

## 2014-05-14 NOTE — Progress Notes (Signed)
Patient ID: Kristen Davila, female   DOB: 09-14-1980, 34 y.o.   MRN: [id number] Subjective:     Kristen Davila is a 34 y.o. female who presents for a postpartum visit. She is 6 weeks postpartum following a spontaneous vaginal delivery. I have fully reviewed the prenatal and intrapartum course. The delivery was at 39 gestational weeks. Outcome: spontaneous vaginal delivery. Anesthesia: epidural. Postpartum course has been uncomplicated. Baby's course has been normal. Baby is feeding by both breast and bottle - Similac Advance. Bleeding no bleeding. Bowel function is normal. Bladder function is normal. Patient is not sexually active. Contraception method is nexplanon. Postpartum depression screening: negative.  Constipation: has 1 bowel movement q7-8days, l often painful.  Using colace but feels that sometimes gives baby upset stomach.   The following portions of the patient's history were reviewed and updated as appropriate: allergies, current medications, past family history, past medical history, past social history, past surgical history and problem list.  Review of Systems Pertinent items are noted in HPI.   Objective:    BP 96/61 mmHg  Pulse 77  Wt 157 lb 14.4 oz (71.623 kg)  General:  alert and cooperative   Breasts:  inspection negative, no nipple discharge or bleeding, no masses or nodularity palpable  Lungs: normal effort  Heart:  normal rate  Abdomen: soft, non-tender; bowel sounds normal; no masses,  no organomegaly   Vulva:  normal  Vagina: normal vagina, no discharge, exudate, lesion, or erythema  Rectal Exam: no hemorrhoids        Assessment:     normal postpartum exam. Pap smear not done at today's visit.   Plan:    1. Contraception: Nexplanon 2. Constipation: rx lactulose, discussed PO hydration and fiber intake 3. Follow up as needed.

## 2014-05-15 ENCOUNTER — Encounter: Payer: Self-pay | Admitting: Family Medicine

## 2014-05-15 ENCOUNTER — Telehealth: Payer: Self-pay | Admitting: General Practice

## 2014-05-15 NOTE — Telephone Encounter (Signed)
[facility] Called patient to inquire what pharmacy she uses. Patient requests Rx be sent to [facility]. Rx faxed to [facility].

## 2015-10-16 ENCOUNTER — Ambulatory Visit: Payer: Medicaid Other | Admitting: Family Medicine

## 2015-11-18 ENCOUNTER — Ambulatory Visit: Payer: Medicaid Other | Admitting: Student

## 2016-01-22 ENCOUNTER — Ambulatory Visit: Payer: Self-pay | Admitting: Internal Medicine

## 2016-02-20 ENCOUNTER — Ambulatory Visit (INDEPENDENT_AMBULATORY_CARE_PROVIDER_SITE_OTHER): Payer: Self-pay | Admitting: Family Medicine

## 2016-02-20 ENCOUNTER — Encounter: Payer: Self-pay | Admitting: Family Medicine

## 2016-02-20 VITALS — BP 96/58 | HR 71 | Temp 98.5°F | Ht 61.5 in | Wt 143.8 lb

## 2016-02-20 DIAGNOSIS — R1011 Right upper quadrant pain: Secondary | ICD-10-CM

## 2016-02-20 LAB — POCT URINE PREGNANCY: Preg Test, Ur: NEGATIVE

## 2016-02-20 NOTE — Assessment & Plan Note (Addendum)
Patient is here with new onset right upper quadrant abdominal pain. Signs and symptoms consistent with biliary colic. Cholecystitis at this time is not present as patient is afebrile and relatively comfortable during exam. Differential also includes pancreatitis, gastric ulcer, and gastroenteritis. I've place labs to be obtained in the future as our lab is currently closed. - CBC, CMP, lipase, lipid panel, urine pregnancy. (Patient will need to come back for these) - Abdominal ultrasound - Discussed adequate hydration, avoidance of fatty foods, and avoidance of hepatotoxic medications. - Strict return precautions and symptoms that warrant ED evaluation discussed.  It was brought to my attention that patient does not have insurance. I believe that it is important that patient received an abdominal ultrasound soon as patient's has a previous ultrasound in 2015 that showed biliary sludge/stones. I believe that patient would likely warrant a repeat ultrasound and general surgery consultation, and would benefit from having this performed sooner rather than later. I will discuss with our staff to look into setting patient up with the "Orange Card".

## 2016-02-20 NOTE — Patient Instructions (Addendum)
Abdominal Pain Treatment - you should: - Stay well hydrated with water. - Avoid medications like Tylenol. You can take ibuprofen/Advil/Motrin with moderation for discomfort. - Avoid fatty foods. - I will talk with the staff tomorrow about arranging an appointment to have you set up for the orange card.  Call us or go to the ER if you have severe pain, bleeding, or high fever. Come back to see us in 1-2 weeks.

## 2016-02-20 NOTE — Progress Notes (Signed)
ABDOMINAL PAIN Had similar pain about 2 years ago. About a month ago she had this recur, lasted about a week, then left. Then started again yesterday. Worse with deep breathing. Worse with meals >> after 30min after.  Pain began 1 days ago Medications tried: advil >> helps some Similar pain before: yes (see above) Prior abdominal surgeries: no  Symptoms Nausea/vomiting: nausea with the pain (no vomiting) Diarrhea: typically has constipation; but recently having loose stools (no increase in frequency) Constipation: yes Blood in stool: no Blood in vomit: n/a Fever: no Dysuria: no Loss of appetite: no Weight loss: no  Vaginal Bleeding: no Missed menstrual period: no; LMP 12/11  Review of Symptoms - see HPI PMH - Smoking status noted.    CC, SH/smoking status, and VS noted  Objective: BP (!) 96/58   Pulse 71   Temp 98.5 F (36.9 C) (Oral)   Ht 5' 1.5" (1.562 m)   Wt 143 lb 12.8 oz (65.2 kg)   LMP 02/03/2016   BMI 26.73 kg/m  Gen: NAD, alert, cooperative, and pleasant. HEENT: NCAT, EOMI, PERRL, MMM, no scleral icterus CV: RRR, no murmur Resp: CTAB, no wheezes, non-labored Abd: S, TTP at RUQ, ND, BS present, negative Murphy's, negative obturators, negative psoas, no guarding or organomegaly. Ext: No edema, warm Neuro: Alert and oriented, Speech clear, No gross deficits   Assessment and plan:  RUQ abdominal pain Patient is here with new onset right upper quadrant abdominal pain. Signs and symptoms consistent with biliary colic. Cholecystitis at this time is not present as patient is afebrile and relatively comfortable during exam. Differential also includes pancreatitis, gastric ulcer, and gastroenteritis. I've place labs to be obtained in the future as our lab is currently closed. - CBC, CMP, lipase, lipid panel, urine pregnancy. (Patient will need to come back for these) - Abdominal ultrasound - Discussed adequate hydration, avoidance of fatty foods, and avoidance of  hepatotoxic medications. - Strict return precautions and symptoms that warrant ED evaluation discussed.  It was brought to my attention that patient does not have insurance. I believe that it is important that patient received an abdominal ultrasound soon as patient's has a previous ultrasound in 2015 that showed biliary sludge/stones. I believe that patient would likely warrant a repeat ultrasound and general surgery consultation, and would benefit from having this performed sooner rather than later. I will discuss with our staff to look into setting patient up with the "Orange Card".   Orders Placed This Encounter  Procedures  . US Abdomen Complete    EPIC ORDER Wt 143/no needs/selfpay    Standing Status:   Future    Standing Expiration Date:   04/22/2017    Order Specific Question:   Reason for Exam (SYMPTOM  OR DIAGNOSIS REQUIRED)    Answer:   RUQ pain; r/o cholecystitis    Order Specific Question:   Preferred imaging location?    Answer:   [facility]  . Comprehensive metabolic panel    Standing Status:   Future    Standing Expiration Date:   02/19/2017    Order Specific Question:   Has the patient fasted?    Answer:   No  . CBC    Standing Status:   Future    Standing Expiration Date:   02/19/2017  . Lipase    Standing Status:   Future    Standing Expiration Date:   02/19/2017  . Lipid panel    Standing Status:   Future  Standing Expiration Date:   02/19/2017    Order Specific Question:   Has the patient fasted?    Answer:   No  . POCT urine pregnancy     Kathee DeltonIan D Keylin Podolsky, MD,MS,  PGY3 02/21/2016 1:47 PM

## 2016-03-02 ENCOUNTER — Ambulatory Visit (INDEPENDENT_AMBULATORY_CARE_PROVIDER_SITE_OTHER): Payer: Self-pay | Admitting: Internal Medicine

## 2016-03-02 ENCOUNTER — Encounter: Payer: Self-pay | Admitting: Psychology

## 2016-03-02 VITALS — BP 82/60 | HR 76 | Temp 98.5°F | Ht 61.5 in | Wt 146.0 lb

## 2016-03-02 DIAGNOSIS — L659 Nonscarring hair loss, unspecified: Secondary | ICD-10-CM

## 2016-03-02 DIAGNOSIS — R42 Dizziness and giddiness: Secondary | ICD-10-CM

## 2016-03-02 DIAGNOSIS — F329 Major depressive disorder, single episode, unspecified: Secondary | ICD-10-CM

## 2016-03-02 DIAGNOSIS — R1011 Right upper quadrant pain: Secondary | ICD-10-CM

## 2016-03-02 DIAGNOSIS — F32A Depression, unspecified: Secondary | ICD-10-CM

## 2016-03-02 LAB — TSH: TSH: 2.46 mIU/L

## 2016-03-02 NOTE — Patient Instructions (Signed)
[name],  Gracias por venir hoy.  Lamento que tengas dolor abdominal.  Creo que es muy probable que tengas dolor de clculos biliares. La informacin ms til sera el ultrasonido para buscar clculos. Por favor haga esto lo antes posible. Controlaremos su funcin heptica hoy y los electrolitos. Una vez que tenga la tarjeta naranja, sera bueno que tambin verifique su colesterol.  Para aumentar la fatiga y la prdida de cabello, verificare una medida de su funcin tiroidea.  Le pedir a alguien de [place] clnica que se comunique con usted sobre el programa de tarjeta naranja.  Por favor aumente la ingesta de lquidos para ayudar con los [employer].  ----------------------------------------------------------------  I am sorry you are having abdominal pain.  I think most likely you are having pain from gallstones. The most helpful piece of information would be the ultrasound to look for stones. Please have this done at your earliest convenience. We will check your liver function today and electrolytes. Once you have the orange card, it would be good to check your cholesterol as well.   For increased fatigue and hair loss, I will check a measure of your thyroid function.  I will ask someone from our clinic to contact you about the orange card program.   Please increase fluid intake to help with dizziness.   Best, Dr. Sampson Goon   Colelitiasis (Cholelithiasis) La colelitiasis (tambin llamada clculos en la vescula) es una enfermedad en la que se forman piedras en la vescula. La vescula es un rgano que almacena la bilis que se forma en el hgado y que ayuda a Engineer, agricultural. Los clculos comienzan como pequeos cristales y lentamente se transforman en piedras. El dolor en la vescula ocurre cuando se producen espasmos y los clculos obstruyen el conducto. El dolor tambin se produce cuando una piedra sale por el conducto. FACTORES DE RIESGO  Ser mujer.  Tener  embarazos mltiples. Algunas veces los mdicos aconsejan extirpar los clculos biliares antes de futuros embarazos.  Ser obeso.  Dietas que incluyan comidas fritas y grasas.  Ser mayor de 3 aos y el aumento de la edad.  El uso prolongado de medicamentos que contengan hormonas femeninas.  Tener diabetes mellitus.  Prdida rpida de peso.  Historia familiar de clculos (herencia). SNTOMAS  Nuseas.  Vmitos.  Dolor abdominal.  Piel amarilla (ictericia)  Dolor sbito. Puede persistir desde algunos minutos hasta algunas horas.  Grant Ruts.  Sensibilidad al tacto. En algunos casos, cuando los clculos biliares no se mueven hacia el conducto biliar, las personas no sienten dolor ni presentan sntomas. Estos se denominan clculos "silenciosos". TRATAMIENTO Los clculos silenciosos no requieren TEFL teacher. En los [employer], podr ser [place]. Las opciones de tratamiento son:  Kandis Ban para extirpar la vescula. Es el tratamiento ms frecuente.  Medicamentos. No siempre dan resultado y pueden demorar entre 6 y 12 meses o ms en Scientist, water quality.  Tratamiento con ondas de choque (litotricia biliar extracorporal). En este tratamiento, una mquina de ultrasonido enva ondas de choque a la vescula para destruir los clculos en pequeos fragmentos que luego podrn pasar a los intestinos o ser disueltas con medicamentos. INSTRUCCIONES PARA EL CUIDADO EN EL HOGAR  Slo tome medicamentos de venta libre o recetados para Primary school teacher, Environmental health practitioner o bajar la fiebre, segn las indicaciones de su mdico.  Siga una dieta baja en grasas hasta que su mdico lo vea nuevamente. Las grasas hacen que la vescula se Technical sales engineer, lo que puede Engineer, agricultural.  Concurra  a las consultas de control con su mdico segn las indicaciones. Los ataques casi siempre son recurrentes y generalmente habr que someterse a una ciruga como Nilwoodtratamiento permanente. SOLICITE ATENCIN MDICA DE  INMEDIATO SI:  El dolor aumenta y no puede controlarlo con los medicamentos.  Tiene fiebre o sntomas persistentes durante ms de 2 - 3 das.  Tiene fiebre y los sntomas empeoran repentinamente.  Tiene nuseas o vmitos persistentes. ASEGRESE DE QUE:  Comprende estas instrucciones.  Controlar su afeccin.  Recibir ayuda de inmediato si no mejora o si empeora. Esta informacin no tiene Theme park managercomo fin reemplazar el consejo del mdico. Asegrese de hacerle al mdico cualquier pregunta que tenga. Document Released: 11/26/2005 Document Revised: 10/12/2012 Document Reviewed: 08/03/2012 Elsevier Interactive Patient Education  2017 [employer]

## 2016-03-02 NOTE — Progress Notes (Signed)
[facility]  Subjective:  Kristen Davila is a 36 y.o. female who presents for SDA with ongoing RUQ and complaint of dizziness.   Visit assisted by Spanish video interpreter Steffany [phone]([id number]).   RUQ pain: - Points to pain under R ribcage - Has had "for a long time" - Had abdominal U/S 06/2013 that showed small gallstones and/or sludge - Last month pain seemed to get worse and came back and again the last few days - Was seen for this 12/28 and labs and RUQ u/s had been recommended but lab had already closed and patient wanting to wait on imaging until she gets established with [employer] - Unsure what foods may make pain worse but has noticed more discomfort after eating red meat; pain can happen at any time but seems to be worse after meals ROS: No fevers, no emesis, no cough, no dysuria  Dizziness: - Has noticed over the last 6 months - Worse if she gets up quickly or turns head quickly - Notes other symptoms of hair falling out, increased fatigue, and fluctuations in weight - Drinks about 5 glasses of water a day - Rarely drinks EtOH ROS: No palpitations, no changes in vision, no tinnitus   Depressed mood: - See PHQ-9 below - No SI/HI  No Known Allergies  Objective: Blood pressure 90/60, pulse 75, temperature 98.5 F (36.9 C), temperature source Oral, height 5' 1.5" (1.562 m), weight 146 lb (66.2 kg), last menstrual period 02/03/2016, SpO2 99 %, unknown if currently breastfeeding. Constitutional: Well-appearing female, in NAD HENT: TMs normal bilaterally. Palpable thyroid but symmetric, nontender, and not enlarged.  Cardiovascular: RRR, S1, S2, no m/r/g.  Pulmonary/Chest: Effort normal and breath sounds normal. No respiratory distress.  Abdominal: Soft. +BS, NT, ND, no rebound or guarding. Patient notes mild discomfort with deep palpation but does not guard or grimace.  Neurological: AOx3, CNII-XII intact, no focal deficits. Normal  finger-nose-finger test. Negative Dix-Hallpike.  Skin: Skin is warm and dry. No rash noted. No erythema.  Psychiatric: Somewhat flat affect.  Vitals reviewed  Depression screen [facility] 2/9 03/02/2016 02/20/2016 10/04/2013  Decreased Interest 1 0 0  Down, Depressed, Hopeless 1 0 1  PHQ - 2 Score 2 0 1  Altered sleeping 3 0 1  Tired, decreased energy 3 0 3  Change in appetite 1 0 1  Feeling bad or failure about yourself  0 0 0  Trouble concentrating 3 0 0  Moving slowly or fidgety/restless 0 0 0  Suicidal thoughts 0 0 0  PHQ-9 Score 12 0 6  Difficult doing work/chores Somewhat difficult - -   Assessment/Plan: RUQ abdominal pain - Chronic in nature. Suspect biliary colic. Will obtains CMP to look at LFTs. Will defer CBC and lipase, as abdominal exam benign today and afebrile. - Would benefit most from repeat RUQ U/S but patient wanting to defer until has payment [facility].  - Counseled on foods that can make symptoms worse  Dizziness - Normal brief Neuro exam. Orthostatics negative but suspect inaccurate readings due to low pulse pressure of 15 during readings though patient alert and has normal capillary refill - BP historically runs low - Encouraged patient to drink more water and get up more slowly - No warning signs such as palpitations, syncope, or chest pain - Will check TSH due to complaint of fatigue and hair loss  Depression - Scored in the moderate range on PHQ-9 today - Has stress related to raising  her children, worse since gave birth 2 years ago - Not interested in medication but would talk to counselor - Asked [facility] consultant Loletta Specter to discuss resources further with patient, to which she was agreeable - Checking TSH as above  Follow-up after RUQ U/S to discuss results.  Dani Gobble, MD [facility], PGY-2

## 2016-03-02 NOTE — Progress Notes (Unsigned)
Dr. Sampson GoonFitzgerald requested a Behavioral Health Consult.   Presenting Issue:  Patient experiencing depressive symptoms since birth of child 2 years ago  Report of symptoms:  Patient feels tired throughout the day and has difficulty sleeping. She has numerous thoughts when trying to go back to sleep that she describes as different from worries.She generally feels under a lot of stress.  Duration of CURRENT symptoms:  Last 2 years since birth of youngest child.  Age of onset of first mood disturbance:  She previously had similar depressive symptoms when her first husband left and was able to recover through time and changing her surroundings.  Impact on function:  Symptoms make it harder to complete many responsibilities to family (husband and 4 children ages 822, 356, [id number], [id number]), and work, though she is still able to manage responsibilities and reports that since school has re-opened, she has a little more time to relax.  Psychiatric History - Diagnoses: No formal previous diagnoses  - Hospitalizations: none - Pharmacotherapy: none - Outpatient therapy: none  Family history of psychiatric issues:  Not assessed  Current and history of substance use:  None  Medical conditions that might explain or contribute to symptoms:  Patient is interested in checking thyroid hormone levels today   PHQ-9:  See Dr. Sampson GoonFitzgerald note GAD-7:   MDQ (if indicated):    Assessment / Plan / Recommendations: [facility] through a Spanish language interpreter. The patient has been struggling with depressive symptoms since the birth of her now 222 year old child. She primarily has difficulties sleeping and is often tired and unmotivated. She feels that with this new child, her husband doesn't help her as much as before. Her support include her husband (to some extent) and her 37 year old daughter who helps parent the younger children. She expressed that there are few activities she enjoys partially because  she hasn't had the time to explore hobbies or spend time with friends. When asked if the patient would like to [facility], the patient was initially interested but cannot meet before 5pm. [facility] provided options for referrals in the community [facility] and received permission to call the client in 2 weeks to check whether referral was successful.

## 2016-03-03 DIAGNOSIS — R42 Dizziness and giddiness: Secondary | ICD-10-CM | POA: Insufficient documentation

## 2016-03-03 DIAGNOSIS — F329 Major depressive disorder, single episode, unspecified: Secondary | ICD-10-CM | POA: Insufficient documentation

## 2016-03-03 DIAGNOSIS — F32A Depression, unspecified: Secondary | ICD-10-CM | POA: Insufficient documentation

## 2016-03-03 LAB — COMPLETE METABOLIC PANEL WITH GFR
ALT: 10 U/L (ref 6–29)
AST: 14 U/L (ref 10–30)
Albumin: 4.2 g/dL (ref 3.6–5.1)
Alkaline Phosphatase: 37 U/L (ref 33–115)
BUN: 13 mg/dL (ref 7–25)
CO2: 25 mmol/L (ref 20–31)
Calcium: 9 mg/dL (ref 8.6–10.2)
Chloride: 107 mmol/L (ref 98–110)
Creat: 0.57 mg/dL (ref 0.50–1.10)
GFR, Est African American: >89 mL/min (ref >=60)
GFR, Est Non African American: >89 mL/min (ref >=60)
GLUCOSE: 68 mg/dL (ref 65–99)
POTASSIUM: 4.4 mmol/L (ref 3.5–5.3)
SODIUM: 139 mmol/L (ref 135–146)
Total Bilirubin: 0.4 mg/dL (ref 0.2–1.2)
Total Protein: 6.8 g/dL (ref 6.1–8.1)

## 2016-03-03 NOTE — Assessment & Plan Note (Signed)
-   Chronic in nature. Suspect biliary colic. Will obtains CMP to look at LFTs. Will defer CBC and lipase, as abdominal exam benign today and afebrile. - Would benefit most from repeat RUQ U/S but patient wanting to defer until has [facility].  - Counseled on foods that can make symptoms worse

## 2016-03-03 NOTE — Assessment & Plan Note (Addendum)
-   Normal brief Neuro exam. Orthostatics negative but suspect inaccurate readings due to low pulse pressure of 15 during readings though patient alert and has normal capillary refill - BP historically runs low - Encouraged patient to drink more water and get up more slowly - No warning signs such as palpitations, syncope, or chest pain - Will check TSH due to complaint of fatigue and hair loss

## 2016-03-03 NOTE — Assessment & Plan Note (Addendum)
-   Scored in the moderate range on PHQ-9 today - Has stress related to raising her children, worse since gave birth 2 years ago - Not interested in medication but would talk to counselor - [facility] Loletta SpecterVaibhav to discuss resources further with patient, to which she was agreeable - Checking TSH as above

## 2016-03-04 ENCOUNTER — Other Ambulatory Visit: Payer: Self-pay

## 2016-03-16 ENCOUNTER — Other Ambulatory Visit: Payer: Self-pay

## 2016-04-13 ENCOUNTER — Ambulatory Visit
Admission: RE | Admit: 2016-04-13 | Discharge: 2016-04-13 | Disposition: A | Discharge status: Home / Self Care | Payer: No Typology Code available for payment source | Source: Ambulatory Visit | Attending: Family Medicine | Admitting: Family Medicine

## 2016-04-13 ENCOUNTER — Other Ambulatory Visit: Payer: Self-pay

## 2016-04-13 DIAGNOSIS — R1011 Right upper quadrant pain: Secondary | ICD-10-CM

## 2016-04-14 ENCOUNTER — Telehealth: Payer: Self-pay | Admitting: Family Medicine

## 2016-04-14 NOTE — Telephone Encounter (Signed)
Will forward to Dr. Sampson GoonFitzgerald who saw patient for this appointment. Jazmin Hartsell,CMA

## 2016-04-14 NOTE — Telephone Encounter (Signed)
Pt is calling and would like to know what her US results were. She in anxious to know what the problem could be. jw

## 2016-04-14 NOTE — Telephone Encounter (Signed)
Left message with help of [employer] Spanish interpreter stating results did not show inflammation, infection, or stones of gallbladder. Left clinic call back number. Would recommend follow-up visit if still having symptoms.

## 2016-05-25 ENCOUNTER — Ambulatory Visit: Payer: Self-pay | Admitting: Family Medicine

## 2016-06-12 ENCOUNTER — Ambulatory Visit: Payer: Self-pay

## 2016-11-11 ENCOUNTER — Encounter: Payer: Self-pay | Admitting: Student in an Organized Health Care Education/Training Program

## 2016-11-11 ENCOUNTER — Ambulatory Visit (INDEPENDENT_AMBULATORY_CARE_PROVIDER_SITE_OTHER): Payer: Self-pay | Admitting: Student in an Organized Health Care Education/Training Program

## 2016-11-11 VITALS — BP 92/58 | HR 77 | Temp 98.3°F | Ht 61.5 in | Wt 144.8 lb

## 2016-11-11 DIAGNOSIS — R1011 Right upper quadrant pain: Secondary | ICD-10-CM

## 2016-11-11 DIAGNOSIS — K5900 Constipation, unspecified: Secondary | ICD-10-CM

## 2016-11-11 MED ORDER — POLYETHYLENE GLYCOL 3350 17 GM/SCOOP PO POWD: 17.0000 g | Freq: Every day | ORAL | 1 refills | Status: DC | PRN

## 2016-11-11 NOTE — Patient Instructions (Addendum)
Please schedule a follow up appointment to be seen by Dr. Freddrick March who is your primary care provider   Take 1 capful of miralax and titrate as needed for one to two soft stools per day.

## 2016-11-11 NOTE — Progress Notes (Signed)
   CC: abdominal pain, chronic  HPI: Kristen Davila is a 36 y.o. female with PMH significant for chronic abdominal pain with workup who presents to [facility] today with continued symptoms of abdominal pain.  Interview performed with assistance of pacific interpreters - Spanish.  She reports that the pain has been ongoing for many years, and she reports that she has undergone many tests. She reports the pain is worse with lying down on the right side. She reports taking tylenol and NSAIDs for palliation but pain has persisted. Pain radiates to her back. She reports the pain is 10/10 all day along.  She denies nausea, vomiting, or diarrhea. She denies night sweats or weight loss.  Patient does endorse having regular bowel movements, however feels like she needs to exercise in order to have  Bowel movement.  She goes q2d without exercising and toes endorse sometimes stools are small or hard.  Previously seen for RUQ pain. RUQ U/S 04/13/2016 was unremarkable.   Review of Symptoms:  See HPI for ROS.   CC, SH/smoking status, and VS noted.  Objective: BP (!) 92/58   Pulse 77   Temp 98.3 F (36.8 C) (Oral)   Ht 5' 1.5" (1.562 m)   Wt 65.7 kg (144 lb 12.8 oz)   LMP 10/27/2016 (Exact Date)   SpO2 98%   BMI 26.92 kg/m  GEN: NAD, alert, cooperative, and pleasant. Nontoxic. ENMT: no pharyngeal erythema or exudates NECK: no LAD RESPIRATORY: clear to auscultation bilaterally with no wheezes, rhonchi or rales, good effort CV: RRR, no m/r/g, no peripheral edema GI: soft, mildly tender to deep palpation, non-distended, no hepatosplenomegaly, normoactive BS, no rebound tenderness or guarding. Negative murphy sign SKIN: warm and dry, no rashes or lesions  Assessment and plan:  RUQ abdominal pain No red flags on history or exam. Reassured by previous negative workup. Spoke through each organ system in that area of her abdomen and previous negative workup with the patient. Will try to treat  with miralax for constipation. Patient to follow up with PCP.   Meds ordered this encounter  Medications  . polyethylene glycol powder (GLYCOLAX/MIRALAX) powder    Sig: Take 17 g by mouth daily as needed for mild constipation or moderate constipation.    Dispense:  850 g    Refill:  1    Howard Pouch, MD,MS,  PGY2 11/15/2016 7:14 PM

## 2016-11-15 NOTE — Assessment & Plan Note (Addendum)
No red flags on history or exam. Reassured by previous negative workup as well as exam. Spoke through each organ system in that area of her abdomen and previous negative workup with the patient. Will try to treat with miralax for constipation. Patient to follow up with PCP.

## 2017-09-26 ENCOUNTER — Encounter (HOSPITAL_COMMUNITY): Payer: Self-pay | Admitting: Emergency Medicine

## 2017-09-26 ENCOUNTER — Emergency Department (HOSPITAL_COMMUNITY)
Admission: EM | Admit: 2017-09-26 | Discharge: 2017-09-26 | Disposition: A | Discharge status: Home / Self Care | Payer: Self-pay | Attending: Emergency Medicine | Admitting: Emergency Medicine

## 2017-09-26 ENCOUNTER — Other Ambulatory Visit: Payer: Self-pay

## 2017-09-26 ENCOUNTER — Emergency Department (HOSPITAL_COMMUNITY): Payer: Self-pay

## 2017-09-26 DIAGNOSIS — Z79899 Other long term (current) drug therapy: Secondary | ICD-10-CM | POA: Insufficient documentation

## 2017-09-26 DIAGNOSIS — S92352A Displaced fracture of fifth metatarsal bone, left foot, initial encounter for closed fracture: Secondary | ICD-10-CM | POA: Insufficient documentation

## 2017-09-26 DIAGNOSIS — F329 Major depressive disorder, single episode, unspecified: Secondary | ICD-10-CM | POA: Insufficient documentation

## 2017-09-26 DIAGNOSIS — Y998 Other external cause status: Secondary | ICD-10-CM | POA: Insufficient documentation

## 2017-09-26 DIAGNOSIS — W101XXA Fall (on)(from) sidewalk curb, initial encounter: Secondary | ICD-10-CM | POA: Insufficient documentation

## 2017-09-26 DIAGNOSIS — Y929 Unspecified place or not applicable: Secondary | ICD-10-CM | POA: Insufficient documentation

## 2017-09-26 DIAGNOSIS — Y9389 Activity, other specified: Secondary | ICD-10-CM | POA: Insufficient documentation

## 2017-09-26 NOTE — Discharge Instructions (Addendum)
Your xray showed you fractured your 5th metatarsal, the bone on the side of your foot. As discussed you will need to follow up with an Orthopedics in 2-3 days for this injury. Please rest, elevate and ice your foot. Do not put ice directly on your foot. You will need to remain non-weight bearing until you follow up with orthopedics, Use your crutches. The orthopedists we have referred you to is listed on your discharge paperwork. Please call them to schedule an appointment. Return to the ED for an new or concerning symptoms. Please do not remove your splint until you are seen by Orthopedics. May take Tylenol or Ibuprofen as prescribed.  SOLICITE ATENCIN MDICA SI: Lance Mussiene fiebre. El yeso, la frula o la bota estn muy ajustados o muy flojos. El yeso, la frula o la bota se daan. El medicamento no IT trainerle calma el dolor. Siente dolor, hormigueo o entumecimiento en el pie, y estos sntomas no desaparecen. SOLICITE ATENCIN MDICA DE INMEDIATO SI: Siente dolor intenso. Tiene hormigueo o entumecimiento que Baker Hughes Incorporatedempeoran en el pie. Tiene el pie fro o adormecido. El pie cambia de color.

## 2017-09-26 NOTE — ED Notes (Signed)
Ortho tech in room 

## 2017-09-26 NOTE — Progress Notes (Signed)
Orthopedic Tech Progress Note Patient Details:  Kristen PootBlanca D Serrano Davila 01/29/1981 [id number]  Ortho Devices Type of Ortho Device: Ace wrap, Crutches, Post (short leg) splint Ortho Device/Splint Location: lle Ortho Device/Splint Interventions: Application   Post Interventions Patient Tolerated: Well Instructions Provided: Care of device   Nikki DomCrawford, Benisha Hadaway 09/26/2017, 11:51 AM

## 2017-09-26 NOTE — ED Notes (Signed)
Ankle splint placed

## 2017-09-26 NOTE — ED Provider Notes (Signed)
[facility] EMERGENCY DEPARTMENT Provider Note   CSN: [id number] Arrival date & time: 09/26/17  1003     History   Chief Complaint Chief Complaint  Patient presents with  . Ankle Pain    HPI Kristen Davila is a 37 y.o. female who presents to the ED with 1 day history of left lateral foot pain post mechanical fall. Denies head injury or LOC.  Patient presents with her boyfriend. I offered medical interpreter however patient declined.  States that she stepped off a curb and inverted her left ankle on 09/25/2017.  States her pain is rated a 4/10 and describes it as a throbbing pain. Pain is constant in nature. Admits to swelling to lateral ankle. Denies redness or ecchymosis to the ankle or foot, numbness, tingling or loss of sensation, or decreased ROM. Denies aggravating and alleviating symptoms.    Past Medical History:  Diagnosis Date  . Constipation   . Headache(784.0)   . Hemorrhoids   . Varicose vein     Patient Active Problem List   Diagnosis Date Noted  . Dizziness 03/03/2016  . Depression 03/03/2016  . Active labor at term 04/03/2014  . History of poor fetal growth 01/16/2014  . History of preterm delivery, currently pregnant in third trimester 08/24/2013  . RUQ abdominal pain 06/28/2013  . Solar lentigo 05/29/2011  . Acne 05/29/2011    History reviewed. No pertinent surgical history.   OB History    Gravida  4   Para  4   Term  3   Preterm  1   AB      Living  2     SAB      TAB      Ectopic      Multiple  0   Live Births  2            Home Medications    Prior to Admission medications   Medication Sig Start Date End Date Taking? Authorizing Provider  lactulose (CHRONULAC) 10 GM/15ML solution Take 30 mLs (20 g total) by mouth daily as needed for mild constipation. 05/14/14   Fredirick Lathe, MD  polyethylene glycol powder (GLYCOLAX/MIRALAX) powder Take 17 g by mouth daily as needed for mild constipation or  moderate constipation. 11/11/16   Howard Pouch, MD  Prenatal Vit-Fe Fumarate-FA (PRENATAL VITAMINS) 28-0.8 MG TABS Take 1 tablet by mouth daily. 10/04/13   Leona Singleton, MD    Family History No family history on file.  Social History Social History   Tobacco Use  . Smoking status: Never Smoker  . Smokeless tobacco: Never Used  Substance Use Topics  . Alcohol use: No  . Drug use: No     Allergies   Patient has no known allergies.   Review of Systems Review of Systems  All other review of systems negative unless otherwise noted in the HPI.   Physical Exam Updated Vital Signs BP (!) 115/57 (BP Location: Right Arm)   Pulse 87   Temp 98.4 F (36.9 C) (Oral)   Resp 17   Ht 5\' 2"  (1.575 m)   Wt 65.8 kg (145 lb)   LMP 09/26/2017   SpO2 100%   BMI 26.52 kg/m   Physical Exam  Constitutional: She appears well-developed and well-nourished.  Cardiovascular: Normal rate and intact distal pulses.  Pulmonary/Chest: Effort normal. No respiratory distress.  Abdominal: She exhibits no distension.  Musculoskeletal:  Tenderness and edema to the lateral left foot. Tenderness most  prominent to the 5th metatarsal. Full ROM with plantar flexion and dorsiflexion. Patient unable to invert the left foot secondary to pain.  Neurological: No sensory deficit.  Skin: Skin is warm and dry. No erythema. No pallor.  No ecchymosis.  Psychiatric: She has a normal mood and affect.     ED Treatments / Results  Labs (all labs ordered are listed, but only abnormal results are displayed) Labs Reviewed - No data to display  EKG None  Radiology Dg Foot Complete Left  Result Date: 09/26/2017 CLINICAL DATA:  Foot pain and swelling following twisting injury the other day. EXAM: LEFT FOOT - COMPLETE 3+ VIEW COMPARISON:  None. FINDINGS: The mineralization and alignment are normal. There is a minimally displaced intra-articular avulsion fracture involving the base of the 5th metatarsal. No  other fractures are demonstrated. The alignment is normal at the Lisfranc joint. There is prominent dorsal forefoot soft tissue swelling on the lateral view. IMPRESSION: Minimally displaced intra-articular avulsion fracture of the 5th metatarsal base. Electronically Signed   By: Carey BullocksWilliam  Veazey M.D.   On: 09/26/2017 10:53    Procedures Procedures (including critical care time)  Medications Ordered in ED Medications - No data to display   Initial Impression / Assessment and Plan / ED Course  I have reviewed the triage vital signs and the nursing notes and patients past medical history.  Pertinent labs & imaging results that were available during my care of the patient were reviewed by me and considered in my medical decision making (see chart for details).  Personally reviewed plain films and agree with radiologist findings of minimally displaced avulsion fracture of the fifth metatarsal base. Patient denies need for analgesia during visit. Discussed with patient and boyfriend follow up with orthopedics for further evaluation and treatment. Will place a splint and will place patient non weight bearing until they followup with orthopedics. Conservative therapy recommended and discussed. Patient will be dc home & is agreeable with above plan. I have also discussed reasons to return immediately to the ER.  Patient expresses understanding and agrees with plan.     Final Clinical Impressions(s) / ED Diagnoses   Final diagnoses:  Closed displaced fracture of fifth metatarsal bone of left foot, initial encounter    ED Discharge Orders    None       Leela Vanbrocklin A, PA-C 09/26/17 1752    Tilden Fossaees, Elizabeth, MD 09/27/17 [phone]

## 2017-09-26 NOTE — ED Triage Notes (Signed)
Pt. Stated, I rolled my ankle yesterday, lt foot/ankle

## 2017-11-23 ENCOUNTER — Ambulatory Visit (INDEPENDENT_AMBULATORY_CARE_PROVIDER_SITE_OTHER): Payer: Self-pay | Admitting: Family Medicine

## 2017-11-23 ENCOUNTER — Other Ambulatory Visit: Payer: Self-pay

## 2017-11-23 ENCOUNTER — Emergency Department (HOSPITAL_COMMUNITY): Admission: EM | Admit: 2017-11-23 | Discharge: 2017-11-23 | Discharge status: Against Medical Advice | Payer: Self-pay

## 2017-11-23 VITALS — BP 96/66 | HR 76 | Temp 98.7°F | Ht 62.0 in | Wt 147.2 lb

## 2017-11-23 DIAGNOSIS — Z3200 Encounter for pregnancy test, result unknown: Secondary | ICD-10-CM

## 2017-11-23 DIAGNOSIS — M7989 Other specified soft tissue disorders: Secondary | ICD-10-CM

## 2017-11-23 DIAGNOSIS — Z3202 Encounter for pregnancy test, result negative: Secondary | ICD-10-CM

## 2017-11-23 LAB — POCT URINE PREGNANCY: Preg Test, Ur: NEGATIVE

## 2017-11-23 NOTE — Progress Notes (Signed)
   CC: L foot swelling  HPI  L 5th metatarsal fracture: went to ED after twisting it on 09/26/17. XR With 5th metatarsal avlusion fracture. She went to ortho (says it was on [facility]) and they said there was a crack and she used the boot (they said to wear x 6 weeks and return, but she couldn't afford the copay). But it is still swelling. No further injury. She hurts on the sole of her foot, still can't bear weight on it. Also has knee pain on the front, was using a scooter x 3 weeks because she was trying to work w the boot. She had an accident many years ago on R medial aspect and she doesn't know if this is important. No hx of blood clots. Has a hx of varicose veins she thinks.   ROS: Denies CP, SOB, abdominal pain, dysuria, changes in BMs.   CC, SH/smoking status, and VS noted  Objective: BP 96/66   Pulse 76   Temp 98.7 F (37.1 C) (Oral)   Ht 5\' 2"  (1.575 m)   Wt 147 lb 3.2 oz (66.8 kg)   LMP 11/23/2017   SpO2 99%   BMI 26.92 kg/m  Gen: NAD, alert, cooperative, and pleasant. HEENT: NCAT, EOMI, PERRL CV: RRR, no murmur Resp: CTAB, no wheezes, non-labored Abd: SNTND, BS present, no guarding or organomegaly Ext: L leg swollen and larger than R up to mid calf, 1+ pitting edema. 2+ DP and PT pulses. Mild hyperpigmentation of the L forefoot.  Neuro: Alert and oriented, Speech clear, No gross deficits  Assessment and plan:  1. Left leg swelling C/w Wells score DVT 3, high risk for DVT after immobilization with L 5th metatarsal fracture. Tried to schedule STAT outpatient imaging, but unavailable until tomorrow. She will go to the ED now. Would be a good candidate for outpatient anticoagulation if positive.  - POCT urine pregnancy  2. Possible pregnancy UPT in case we need to do anticoagulation.     Orders Placed This Encounter  Procedures  . CBC  . Basic metabolic panel  . POCT urine pregnancy    Loni Muse, MD, PGY3 11/23/2017 4:52 PM

## 2017-11-23 NOTE — ED Notes (Signed)
Patient states she does not want to wait. States she will call her PCP. Patient left ambulatory.

## 2017-11-23 NOTE — Patient Instructions (Addendum)
It was a pleasure to see you today! Thank you for choosing [facility] for your primary care. Kristen Davila was seen for leg swelling.   Our plans for today were:  Go straight to the hospital to get your leg imaged. We will call you with the plan.   Best,  Dr. Chanetta Marshall   TRiage team- I am concerned for DVT 2/2 unilateral swelling after immobilization in boot x 6 weeks due to foot fracture. Ordered DVT US stat, but no one is able to perform it tonight.

## 2017-11-24 ENCOUNTER — Ambulatory Visit (HOSPITAL_COMMUNITY): Payer: Self-pay

## 2017-11-25 ENCOUNTER — Telehealth: Payer: Self-pay | Admitting: Family Medicine

## 2017-11-25 NOTE — Telephone Encounter (Signed)
Pt was seen by Dr. Chanetta Marshall yesterday. Pt would like to know if there is anywhere else she can have orders sent for the Ultra sound of her leg. Pt states she went to ED yesterday after appointment but did not want to wait that long. I told her that she could go back today and she said she does not want it done there. Please call patient to let her know if the orders can be sent somewhere else.

## 2017-11-28 ENCOUNTER — Emergency Department (HOSPITAL_COMMUNITY)
Admission: EM | Admit: 2017-11-28 | Discharge: 2017-11-28 | Disposition: A | Discharge status: Home / Self Care | Payer: Self-pay | Attending: Emergency Medicine | Admitting: Emergency Medicine

## 2017-11-28 ENCOUNTER — Emergency Department (HOSPITAL_BASED_OUTPATIENT_CLINIC_OR_DEPARTMENT_OTHER): Payer: Self-pay

## 2017-11-28 ENCOUNTER — Encounter (HOSPITAL_COMMUNITY): Payer: Self-pay | Admitting: Emergency Medicine

## 2017-11-28 DIAGNOSIS — M79609 Pain in unspecified limb: Secondary | ICD-10-CM

## 2017-11-28 DIAGNOSIS — Z79899 Other long term (current) drug therapy: Secondary | ICD-10-CM | POA: Insufficient documentation

## 2017-11-28 DIAGNOSIS — I82492 Acute embolism and thrombosis of other specified deep vein of left lower extremity: Secondary | ICD-10-CM | POA: Insufficient documentation

## 2017-11-28 DIAGNOSIS — R609 Edema, unspecified: Secondary | ICD-10-CM

## 2017-11-28 DIAGNOSIS — I82432 Acute embolism and thrombosis of left popliteal vein: Secondary | ICD-10-CM

## 2017-11-28 MED ORDER — RIVAROXABAN (XARELTO) EDUCATION KIT FOR DVT/PE PATIENTS
PACK | Freq: Once | Status: AC
  Administered 2017-11-28: 13:00:00
  Filled 2017-11-28: qty 1

## 2017-11-28 MED ORDER — RIVAROXABAN 15 MG PO TABS
15.0000 mg | ORAL_TABLET | Freq: Once | ORAL | Status: DC
  Filled 2017-11-28: qty 1

## 2017-11-28 MED ORDER — RIVAROXABAN (XARELTO) VTE STARTER PACK (15 & 20 MG): ORAL_TABLET | ORAL | 0 refills | Status: DC

## 2017-11-28 NOTE — Progress Notes (Signed)
VASCULAR LAB PRELIMINARY  PRELIMINARY  PRELIMINARY  PRELIMINARY  Left lower extremity venous duplex completed.    Preliminary report:  There is age indeterminate DVT noted in the left posterior tibial and peroneal veins.   Alishah Schulte, RVT 11/28/2017, 12:12 PM

## 2017-11-28 NOTE — ED Notes (Signed)
Pt refused xarelto stating she would like to speak with PCP first. Pt given education on xarelto and given xarelto packet. Signature pad unavailable. Pt verbalized understanding. Pt denied any further questions.

## 2017-11-28 NOTE — ED Notes (Addendum)
In vascular

## 2017-11-28 NOTE — Discharge Instructions (Signed)
Take the Xarelto, a blood thinner, as directed.  Follow-up with your primary care provider tomorrow.  Ask them if they want you to continue taking this medication. Return to the ED for worsening pain, worsening swelling, shortness of breath, chest pain, passing out, or any other major concerns.

## 2017-11-28 NOTE — ED Provider Notes (Signed)
[facility] EMERGENCY DEPARTMENT Provider Note   CSN: [id number] Arrival date & time: 11/28/17  0944     History   Chief Complaint Chief Complaint  Patient presents with  . Leg Swelling    HPI Kristen Davila is a 37 y.o. female.  HPI   Kristen Davila is a 37 y.o. female, presenting to the ED with left lower extremity swelling for last three months.  States she injured her left foot, was evaluated in the ED, and found to have an avulsion fracture of the base of the fifth metatarsal.  She was treated with immobilization. She was referred to Dr. Doreatha Martin, orthopedist, and was seen in the office.  She was told she would be immobilized for 6 weeks.  She was told to return to the office for reevaluation and follow-up x-ray.  She did not do so stating financial reasons.  She was seen by her PCP on October 1 and duplex ultrasound was recommended to rule out DVT.  Since that time, her pain has improved and swelling has resolved.  Denies numbness, weakness, subsequent injury, shortness of breath, chest pain, fever/chills, wounds, or any other complaints.    Past Medical History:  Diagnosis Date  . Constipation   . Headache(784.0)   . Hemorrhoids   . Varicose vein     Patient Active Problem List   Diagnosis Date Noted  . Dizziness 03/03/2016  . Depression 03/03/2016  . History of poor fetal growth 01/16/2014  . RUQ abdominal pain 06/28/2013  . Solar lentigo 05/29/2011  . Acne 05/29/2011    History reviewed. No pertinent surgical history.   OB History    Gravida  4   Para  4   Term  3   Preterm  1   AB      Living  2     SAB      TAB      Ectopic      Multiple  0   Live Births  2            Home Medications    Prior to Admission medications   Medication Sig Start Date End Date Taking? Authorizing Provider  lactulose (CHRONULAC) 10 GM/15ML solution Take 30 mLs (20 g total) by mouth daily as needed for mild  constipation. Patient not taking: Reported on 11/28/2017 05/14/14   Nila Nephew, MD  polyethylene glycol powder [facility]) powder Take 17 g by mouth daily as needed for mild constipation or moderate constipation. Patient not taking: Reported on 11/28/2017 11/11/16   Everrett Coombe, MD  Prenatal Vit-Fe Fumarate-FA (PRENATAL VITAMINS) 28-0.8 MG TABS Take 1 tablet by mouth daily. Patient not taking: Reported on 11/28/2017 10/04/13   Hilton Sinclair, MD  Rivaroxaban 15 & 20 MG TBPK Take as directed on package: Start with one 7m tablet by mouth twice a day with food. On Day 22, switch to one 290mtablet once a day with food. 11/28/17   Camil Hausmann, ShHelane GuntherPA-C    Family History No family history on file.  Social History Social History   Tobacco Use  . Smoking status: Never Smoker  . Smokeless tobacco: Never Used  Substance Use Topics  . Alcohol use: No  . Drug use: No     Allergies   Patient has no known allergies.   Review of Systems Review of Systems  Constitutional: Negative for chills, diaphoresis and fever.  Respiratory: Negative for shortness of breath.   Cardiovascular:  Positive for leg swelling. Negative for chest pain and palpitations.  Gastrointestinal: Negative for abdominal pain, nausea and vomiting.  Skin: Negative for color change and wound.  Neurological: Negative for weakness and numbness.  All other systems reviewed and are negative.    Physical Exam Updated Vital Signs BP (!) 109/52 (BP Location: Right Arm)   Pulse 82   Temp 98.2 F (36.8 C) (Oral)   Resp 14   Ht 5' 2"  (1.575 m)   Wt 66.7 kg   LMP 11/23/2017   SpO2 100%   BMI 26.89 kg/m   Physical Exam  Constitutional: She appears well-developed and well-nourished. No distress.  HENT:  Head: Normocephalic and atraumatic.  Eyes: Conjunctivae are normal.  Neck: Neck supple.  Cardiovascular: Normal rate, regular rhythm, normal heart sounds and intact distal pulses.  Pulmonary/Chest: Effort  normal and breath sounds normal. No respiratory distress.  Abdominal: Soft. There is no tenderness. There is no guarding.  Musculoskeletal: She exhibits no edema or tenderness.  Left lower extremity examined without noted swelling, tenderness, erythema, increased warmth, or wounds.  Full range of motion without pain in the toes, foot, ankle, and knee.  Patient ambulatory without assistance or noted difficulty.  Lymphadenopathy:    She has no cervical adenopathy.  Neurological: She is alert.  Skin: Skin is warm and dry. She is not diaphoretic.  Psychiatric: She has a normal mood and affect. Her behavior is normal.  Nursing note and vitals reviewed.    ED Treatments / Results  Labs (all labs ordered are listed, but only abnormal results are displayed) Labs Reviewed - No data to display  EKG None  Radiology No results found.   VASCULAR LAB PRELIMINARY  PRELIMINARY  PRELIMINARY  PRELIMINARY  Left lower extremity venous duplex completed.    Preliminary report:  There is age indeterminate DVT noted in the left posterior tibial and peroneal veins.   KANADY, CANDACE, RVT 11/28/2017, 12:12 PM  Procedures Procedures (including critical care time)  Medications Ordered in ED Medications  Rivaroxaban (XARELTO) tablet 15 mg (15 mg Oral Not Given 11/28/17 1305)  rivaroxaban [employer]) Education Kit for DVT/PE patients ( Does not apply Given 11/28/17 1305)     Initial Impression / Assessment and Plan / ED Course  I have reviewed the triage vital signs and the nursing notes.  Pertinent labs & imaging results that were available during my care of the patient were reviewed by me and considered in my medical decision making (see chart for details).     Patient presents with complaint of continued swelling.  PCP had concern for DVT.  No abnormalities noted on exam today.  Preliminary duplex ultrasound notes an age-indeterminate DVT in the left posterior tibial and peroneal veins.  No  tenderness in these areas.  Patient was offered repeat x-ray, as was the plan through orthopedics, but she declined. We discussed anticoagulation, patient agreed, but then declined it stating she changed her mind.  She was still sent home with a prescription for Xarelto along with instructions.  She will follow-up with her PCP tomorrow.  Return precautions discussed.  Patient states she understands the plan and further instructions.   She was evaluated by family medicine on October 1 due to continued pain in the foot as well as swelling in the foot and lower leg.  Resident note states an outpatient ultrasound would not be available for the patient until October 2.  It was recommended she go to the ED after her appointment October 1.  Another note indicates she went to the ED, but left because she did not want to wait.    Final Clinical Impressions(s) / ED Diagnoses   Final diagnoses:  Deep vein thrombosis (DVT) of popliteal vein of left lower extremity, unspecified chronicity [facility])    ED Discharge Orders         Ordered    Rivaroxaban 15 & 20 MG TBPK     11/28/17 1245           Lorayne Bender, PA-C 11/28/17 1508    Valarie Merino, MD 11/28/17 2208

## 2017-11-28 NOTE — ED Triage Notes (Addendum)
Pt reports she cracked a bone in her L foot 3 months ago, started having swelling to lower L leg, saw her pcp on 10/1  who wanted her to have a doppler study but could not get it done outpatient so she was sent here to have dvt study. Denies pain.

## 2017-11-28 NOTE — ED Notes (Signed)
*   PT is with Daughter in PEDS *

## 2017-11-29 NOTE — Telephone Encounter (Signed)
Patient needs to be seen if she is still having leg swelling. She had agreed to go to the ED last week, rather than get stat imaging as an outpatient. I would like her to be seen before we order imaging, to ensure her O2 sats are normal and there are no additional concerns with the leg swelling.

## 2017-11-30 ENCOUNTER — Other Ambulatory Visit: Payer: Self-pay

## 2017-11-30 ENCOUNTER — Ambulatory Visit (INDEPENDENT_AMBULATORY_CARE_PROVIDER_SITE_OTHER): Payer: Self-pay | Admitting: Family Medicine

## 2017-11-30 DIAGNOSIS — I82402 Acute embolism and thrombosis of unspecified deep veins of left lower extremity: Secondary | ICD-10-CM

## 2017-11-30 MED ORDER — RIVAROXABAN (XARELTO) VTE STARTER PACK (15 & 20 MG): ORAL_TABLET | ORAL | 0 refills | Status: DC

## 2017-11-30 NOTE — Assessment & Plan Note (Addendum)
Unspecified chronicity based on DVT venous Doppler.  Likely related to immobilization with boot following left fifth metatarsal avulsion fracture. - Discussed risk first benefits of using Xarelto with plan to begin starter pack - Xarelto starter pack re-sent to [facility] health department for medication assistance program - RTC 1 month and will likely need treatment for a duration of 3 months given an initial provoked DVT - Reviewed return precautions - Administered flu vaccine

## 2017-11-30 NOTE — Patient Instructions (Addendum)
Gracias por venir a vernos hoy. Consulte a continuacin para revisar nuestro plan para la visita de hoy.  Por favor, tome el Xarelto 15 mg dos veces al da durante [address], luego 20 mg al [employer] termine. Seguimiento en 1 mes.  Departamento de [place]: [phone]  Llame a la clnica al [phone] si sus sntomas empeoran o si tiene alguna inquietud. Fue Regulatory affairs officer.  Durward Parcel, DO [facility] [place] [facility] [place], PGY-3    Thank you for coming in to see [place] today. Please see below to review our plan for today's visit.  Please take the Xarelto 15 mg twice daily for 21 days, then 20 mg daily until you finish. Follow up in 1 month.  [facility] Health Department:  [phone]  Please call the clinic at [phone] if your symptoms worsen or you have any concerns. It was our pleasure to serve you.  Durward Parcel, DO [facility], PGY-3

## 2017-11-30 NOTE — Telephone Encounter (Signed)
Contacted pt and she is coming in today on ATC for this. Lamonte Sakai, Shellie Rogoff D, [place]

## 2017-11-30 NOTE — Progress Notes (Signed)
   Subjective   Patient ID: Kristen Davila    DOB: 1980-07-01, 37 y.o. female   MRN: [id number] [employer] interpreter used during interview: Maxine Glenn [phone] (Spanish)  CC: "DVT"  HPI: Kristen Davila is a 37 y.o. female who presents to clinic today for the following:  Left leg DVT: Patient seen approximately 7 days ago for left lower extremity swelling.  Nonemergent left lower extremity DVT sound was ordered.  Patient was seen at [facility] ED 2 days ago for left lower extremity swelling which was persistent over the last 3 months following immobilization after left foot injury resulting in avulsion fracture of the base of fifth metatarsal. DVT ultrasound revealed intermediate DVT involving left posterior tibial vein and left peroneal vein.  Patient has not yet began medication and wanted questions answered regarding how she got a DVT and if she needed to take the anticoagulation.  She has no prior history of DVTs or PEs and denies history of bleeding.  ROS: see HPI for pertinent.  PMFSH: Reviewed. Smoking status reviewed. Medications reviewed.  Objective   LMP 11/23/2017  Vitals and nursing note reviewed.  General: well nourished, well developed, NAD with non-toxic appearance HEENT: normocephalic, atraumatic, moist mucous membranes Neck: supple, non-tender without lymphadenopathy Cardiovascular: regular rate and rhythm without murmurs, rubs, or gallops Lungs: clear to auscultation bilaterally with normal work of breathing Abdomen: soft, non-tender, non-distended, normoactive bowel sounds Skin: warm, dry, no rashes or lesions, cap refill < 2 seconds Extremities: warm and well perfused, normal tone, symmetrical calf length with nonedematous legs or palpable cords  Assessment & Plan   Deep vein thrombosis (DVT) of left lower extremity (HCC) Unspecified chronicity based on DVT venous Doppler.  Likely related to immobilization with boot following left fifth metatarsal avulsion  fracture. - Discussed risk first benefits of using Xarelto with plan to begin starter pack - Xarelto starter pack re-sent to [facility] health department for medication assistance program - RTC 1 month and will likely need treatment for a duration of 3 months given an initial provoked DVT - Reviewed return precautions - Administered flu vaccine  No orders of the defined types were placed in this encounter.  Meds ordered this encounter  Medications  . Rivaroxaban 15 & 20 MG TBPK    Sig: Start with one 15mg  tablet by mouth twice a day with food. On Day 22, switch to one 20mg  tablet once a day with food.    Dispense:  51 each    Refill:  0    Medication Assistance Program.    Durward Parcel, DO [facility], PGY-3 11/30/2017, 5:24 PM

## 2017-12-20 ENCOUNTER — Ambulatory Visit (INDEPENDENT_AMBULATORY_CARE_PROVIDER_SITE_OTHER): Payer: Self-pay | Admitting: Family Medicine

## 2017-12-20 ENCOUNTER — Other Ambulatory Visit: Payer: Self-pay

## 2017-12-20 VITALS — BP 90/64 | HR 67 | Temp 98.5°F | Ht 62.0 in | Wt 146.8 lb

## 2017-12-20 DIAGNOSIS — I82402 Acute embolism and thrombosis of unspecified deep veins of left lower extremity: Secondary | ICD-10-CM

## 2017-12-20 DIAGNOSIS — M79672 Pain in left foot: Secondary | ICD-10-CM

## 2017-12-20 NOTE — Patient Instructions (Signed)
It was nice meeting you today.  You were seen in clinic for follow-up after your left foot fracture which appears to be healing well.  I am sending you for a follow-up x-ray at [facility] imaging.  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  I will follow-up with you in 1 month for your next visit.  Please call clinic if any questions.  Freddrick March MD

## 2017-12-20 NOTE — Progress Notes (Signed)
   Subjective:   Patient ID: Kristen Davila    DOB: May 16, 1980, 37 y.o. female   MRN: [id number]  CC: f/u left foot fracture   HPI: Kristen Davila is a 37 y.o. female who presents to clinic today for the following issue.  Left foot fracture Patient reports on 09/26/2017 she fell and twisted her left foot.  She had a left fifth toe metatarsal fracture which was found in the ED.  She has followed up with orthopedics and wearing a boot for the last 6 weeks.  Was advised to return to clinic for follow up and XR but she did not 2/2 financial reasons.  She reports some ongoing swelling.  Most of her pain is localized to the dorsum of her left foot.  She followed up on 10/6 to the ED again for left leg swelling and was found to have a clot on Doppler.  She was seen by Dr. Abelardo Diesel on 10/8 for follow-up and had not started anticoagulation at that time.  She was prescribed Xarelto.  She states she has picked this up yesterday but has still not started taking it yet.  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  No prior history of DVT or PE.  She denies chest pain, shortness of breath.   ROS: No fevers, chills, nausea, vomiting.  No numbness or tingling. +swelling.    Social: pt is a never smoker.  Medications reviewed. Objective:   BP 90/64   Pulse 67   Temp 98.5 F (36.9 C) (Oral)   Ht 5\' 2"  (1.575 m)   Wt 146 lb 12.8 oz (66.6 kg)   LMP 11/23/2017   SpO2 99%   BMI 26.85 kg/m  Vitals and nursing note reviewed.  General: 37 year old female, NAD Neck: supple CV: RRR no MRG  Lungs: CTAB, normal effort  Skin: warm, dry, no rash Extremities: warm and well perfused, 2+ pedal pulses bilaterally, no edema or leg swelling noted, mild tenderness noted over left fifth toe secondary to metatarsal fracture Neuro: alert, oriented x3, no focal deficits   Assessment & Plan:   Deep vein thrombosis (DVT) of left lower extremity (HCC) LLE doppler  revealed age indeterminate DVT in left posterior tibial peroneal veins.  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   Strongly advised her to start her Xarelto today.  She expresses good understanding of this, all questions and concerns addressed.  -Will repeat XR L foot today as she has not followed up with Orthopedics 2/2 financial reasons Return precautions discussed.  -follow up with PCP in 1 month    Orders Placed This Encounter  Procedures  . DG Foot Complete Left    Standing Status:   Future    Standing Expiration Date:   02/20/2019    Order Specific Question:   Reason for Exam (SYMPTOM  OR DIAGNOSIS REQUIRED)    Answer:   follow up 5th metatarsal fracture    Order Specific Question:   Is patient pregnant?    Answer:   No    Order Specific Question:   Preferred imaging location?    Answer:   [facility]    Order Specific Question:   Radiology Contrast Protocol - do NOT remove file path    Answer:   [email or web address]   Freddrick March, MD [facility], PGY-3 12/26/2017 9:47 PM

## 2017-12-26 NOTE — Assessment & Plan Note (Addendum)
LLE doppler revealed age indeterminate DVT in left posterior tibial peroneal veins.  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   Strongly advised her to start her Xarelto today.  She expresses good understanding of this, all questions and concerns addressed.  -Will repeat XR L foot today as she has not followed up with Orthopedics 2/2 financial reasons Return precautions discussed.  -follow up with PCP in 1 month

## 2017-12-29 ENCOUNTER — Ambulatory Visit
Admission: RE | Admit: 2017-12-29 | Discharge: 2017-12-29 | Disposition: A | Discharge status: Home / Self Care | Payer: No Typology Code available for payment source | Source: Ambulatory Visit | Attending: Family Medicine | Admitting: Family Medicine

## 2017-12-29 DIAGNOSIS — M79672 Pain in left foot: Secondary | ICD-10-CM

## 2018-01-17 ENCOUNTER — Ambulatory Visit (INDEPENDENT_AMBULATORY_CARE_PROVIDER_SITE_OTHER): Payer: Self-pay | Admitting: Family Medicine

## 2018-01-17 VITALS — BP 100/70 | HR 76 | Temp 98.3°F | Wt 147.2 lb

## 2018-01-17 DIAGNOSIS — G8929 Other chronic pain: Secondary | ICD-10-CM

## 2018-01-17 DIAGNOSIS — R1011 Right upper quadrant pain: Secondary | ICD-10-CM

## 2018-01-17 DIAGNOSIS — I82402 Acute embolism and thrombosis of unspecified deep veins of left lower extremity: Secondary | ICD-10-CM

## 2018-01-17 MED ORDER — RIVAROXABAN 20 MG PO TABS: 20.0000 mg | ORAL_TABLET | Freq: Every day | ORAL | 1 refills | Status: DC

## 2018-01-17 NOTE — Assessment & Plan Note (Signed)
Stable, chronic.  Patient reports her pain has restarted about 2 days ago.  No red flags of rebound or guarding on exam to suspect acute abdomen.  Previous work-up and ultrasound of right upper quadrant has been negative.  Patient desires MRI today as she is concerned she has cancer.  Reassured patient that this is unlikely however will recheck right upper quadrant ultrasound for any changes.   -RUQ US ordered; pt had some questions regarding insurance and states she will call to schedule this appointment for imaging -CMP, CBC

## 2018-01-17 NOTE — Patient Instructions (Addendum)
It was nice seeing you again today.  You were seen in clinic for chronic abdominal pain.  We discussed different possibilities that could be causing this and I have ordered some blood work and a follow-up ultrasound.  Additionally, for your DVT I have refilled your Xarelto-please take 20 mg daily for your clot.  Please call [place] [facility] to schedule your ultrasound.  Once this is complete, I will call you to discuss the results.  I hope you start to feel better soon.   Freddrick MarchYashika Kailana Benninger MD

## 2018-01-17 NOTE — Progress Notes (Signed)
   Subjective:   Patient ID: Kristen Davila    DOB: [birth year], 37 y.o. female   MRN: [id number]  CC: chronic abdominal pain   HPI: Kristen Davila is a 37 y.o. female who presents to clinic today for the following issue.  Chronic abdominal pain Reports chronic abdominal pain x10 years duration.  Always on right side, localized to RUQ and does not radiate. Had gone away for several months, started again about 2 days ago.  Feels dull in nature, worse pain with eating.  Feels nauseous when she does try to eat.  Has been trying to eat and drink normally.  No history of gallstones or pancreatitis.  She reports occasional alcohol use, she is a never smoker.  No fever, chills.  No diarrhea or constipation.    DVT Taking 20 mg Xarelto daily with good compliance.  No new concerns.  Denies leg pain, calf swelling, shortness of breath.  ROS: See HPI for pertinent ROS.  Social:  Pt is a never smoker.  Medications reviewed. Objective:   BP 100/70   Pulse 76   Temp 98.3 F (36.8 C) (Oral)   Wt 147 lb 3.2 oz (66.8 kg)   LMP 12/27/2017 (Exact Date)   SpO2 100%   BMI 26.92 kg/m  Vitals and nursing note reviewed.  General: 37 year old female, NAD Neck: supple CV: RRR no MRG  Lungs: CTAB, normal effort  Abdomen: soft, mild TTP over right upper quadrant, negative Murphy sign, no rebound or guarding noted, +bs  Skin: warm, dry, no rash Extremities: warm and well perfused  Assessment & Plan:   Deep vein thrombosis (DVT) of left lower extremity (HCC) Stable, reports good compliance on Xarelto, no new symptoms  Xarelto 20 mg daily refilled  RUQ abdominal pain Stable, chronic.  Patient reports her pain has restarted about 2 days ago.  No red flags of rebound or guarding on exam to suspect acute abdomen.  Previous work-up and ultrasound of right upper quadrant has been negative.  Patient desires MRI today as she is concerned she has cancer.  Reassured patient that  this is unlikely however will recheck right upper quadrant ultrasound for any changes.   -RUQ US ordered; pt had some questions regarding insurance and states she will call to schedule this appointment for imaging -CMP, CBC  Orders Placed This Encounter  Procedures  . US Abdomen Limited RUQ    Epic order Wt 147 / no needs - per daughter she can speak and understand english. Daughter spoke to her in spanish only. Declined interpreter Ins - financial assistance - [facility]/daughter     Standing Status:   Future    Standing Expiration Date:   03/20/2019    Order Specific Question:   Reason for Exam (SYMPTOM  OR DIAGNOSIS REQUIRED)    Answer:   chronic RUQ pain    Order Specific Question:   Preferred imaging location?    Answer:   [facility]  . Comprehensive metabolic panel  . CBC   Meds ordered this encounter  Medications  . rivaroxaban (XARELTO) 20 MG TABS tablet    Sig: Take 1 tablet (20 mg total) by mouth daily with supper.    Dispense:  30 tablet    Refill:  1   Freddrick MarchYashika Anoop Hemmer, MD [facility]

## 2018-01-17 NOTE — Assessment & Plan Note (Signed)
Stable, reports good compliance on Xarelto, no new symptoms  Xarelto 20 mg daily refilled

## 2018-01-18 LAB — COMPREHENSIVE METABOLIC PANEL
A/G RATIO: 1.8 (ref 1.2–2.2)
ALK PHOS: 57 IU/L (ref 39–117)
ALT: 13 IU/L (ref 0–32)
AST: 15 IU/L (ref 0–40)
Albumin: 4.4 g/dL (ref 3.5–5.5)
BUN/Creatinine Ratio: 17 (ref 9–23)
BUN: 13 mg/dL (ref 6–20)
Bilirubin Total: 0.4 mg/dL (ref 0.0–1.2)
CO2: 24 mmol/L (ref 20–29)
Calcium: 9.3 mg/dL (ref 8.7–10.2)
Chloride: 103 mmol/L (ref 96–106)
Creatinine, Ser: 0.77 mg/dL (ref 0.57–1.00)
GFR calc Af Amer: 113 mL/min/{1.73_m2} (ref >59)
GFR calc non Af Amer: 98 mL/min/{1.73_m2} (ref >59)
Globulin, Total: 2.4 g/dL (ref 1.5–4.5)
Glucose: 84 mg/dL (ref 65–99)
Potassium: 4.2 mmol/L (ref 3.5–5.2)
Sodium: 139 mmol/L (ref 134–144)
Total Protein: 6.8 g/dL (ref 6.0–8.5)

## 2018-01-18 LAB — CBC
HEMOGLOBIN: 12.5 g/dL (ref 11.1–15.9)
Hematocrit: 39.3 % (ref 34.0–46.6)
MCH: 25.3 pg — ABNORMAL LOW (ref 26.6–33.0)
MCHC: 31.8 g/dL (ref 31.5–35.7)
MCV: 79 fL (ref 79–97)
Platelets: 194 10*3/uL (ref 150–450)
RBC: 4.95 x10E6/uL (ref 3.77–5.28)
RDW: 12.3 % (ref 12.3–15.4)
WBC: 6.2 10*3/uL (ref 3.4–10.8)

## 2018-01-24 ENCOUNTER — Other Ambulatory Visit: Payer: No Typology Code available for payment source

## 2018-01-31 ENCOUNTER — Ambulatory Visit (INDEPENDENT_AMBULATORY_CARE_PROVIDER_SITE_OTHER): Payer: No Typology Code available for payment source | Admitting: Family Medicine

## 2018-01-31 ENCOUNTER — Ambulatory Visit
Admission: RE | Admit: 2018-01-31 | Discharge: 2018-01-31 | Disposition: A | Discharge status: Home / Self Care | Payer: No Typology Code available for payment source | Source: Ambulatory Visit | Attending: Family Medicine | Admitting: Family Medicine

## 2018-01-31 ENCOUNTER — Other Ambulatory Visit (HOSPITAL_COMMUNITY)
Admission: RE | Admit: 2018-01-31 | Discharge: 2018-01-31 | Disposition: A | Discharge status: Home / Self Care | Payer: No Typology Code available for payment source | Source: Ambulatory Visit | Attending: Family Medicine | Admitting: Family Medicine

## 2018-01-31 VITALS — BP 90/70 | HR 69 | Temp 98.9°F | Wt 150.8 lb

## 2018-01-31 DIAGNOSIS — Z01419 Encounter for gynecological examination (general) (routine) without abnormal findings: Secondary | ICD-10-CM

## 2018-01-31 DIAGNOSIS — R1011 Right upper quadrant pain: Secondary | ICD-10-CM

## 2018-01-31 DIAGNOSIS — G8929 Other chronic pain: Secondary | ICD-10-CM

## 2018-01-31 DIAGNOSIS — K644 Residual hemorrhoidal skin tags: Secondary | ICD-10-CM | POA: Insufficient documentation

## 2018-01-31 NOTE — Assessment & Plan Note (Signed)
Chronic abdominal pain has resolved.  Right upper quadrant ultrasound recently revealed 3 mm polyp within the gallbladder, no additional imaging surveillance is warranted at this time. Lab work from 11/25 without evidence of transaminitis. Continue to monitor

## 2018-01-31 NOTE — Patient Instructions (Addendum)
It was nice seeing you again today.  You were seen in clinic for a pap smear today.  I will call you once I have the results of this.  For your hemorrhoids, I would recommend using "Preparation H" which you can find over the counter at any pharmacy.  This may help you with your symptoms.  If you have any worsening bleeding or pain, please call clinic to let us know.   Be well, Freddrick MarchYashika Milani Lowenstein MD

## 2018-01-31 NOTE — Progress Notes (Signed)
   Subjective:   Patient ID: Kristen Davila    DOB: 1980-12-06, 37 y.o. female   MRN: [id number]  CC: pap smear, hemorrhoids, f/u chronic abdominal pain.  HPI: Kristen Davila is a 37 y.o. female who presents to clinic today for the following issue.  Chronic abdominal pain Patient sent for RUQ ultrasound recently for chronic abdominal pain.  Imaging revealed 3 mm polyp within the gallbladder, no additional imaging surveillance warranted.  She states her pain is resolving.    Hemorrhoids Patient reports they are outside, sometimes bleed and she notices blood on the tissue but not mixed in with stool or in the bowl.  She reports bowel movements about every 2 days.  She tries to drink enough water. Has not tried anything OTC for this yet.   Health maintanence:  -due for pap smear today   ROS: Fever, chills, nausea, vomiting.  No abdominal pain, shortness of breath.  Social: Patient is a never smoker. Medications reviewed. Objective:   BP 90/70   Pulse 69   Temp 98.9 F (37.2 C) (Oral)   Wt 150 lb 12.8 oz (68.4 kg)   SpO2 100%   BMI 27.58 kg/m  Vitals and nursing note reviewed.  General: Well appearing 37 year old female, NAD Neck: supple CV: RRR no MRG  Lungs: CTAB, normal effort  Abdomen: soft, NTND, no masses or organomegaly, negative Murphy sign, +bs  Pelvic exam: VULVA: normal appearing vulva with no masses, tenderness or lesions, VAGINA: normal appearing vagina with normal color and discharge, no lesions, CERVIX: normal appearing cervix without discharge or lesions, RECTAL: external hemorrhoids non-thrombosed. Skin: warm, dry, no rash Extremities: warm and well perfused  Assessment & Plan:   External hemorrhoids External hemorrhoids noted on physical exam.  Patient has noticed blood on toilet tissue on several occurrences following constipation.  No signs of active bleeding at this time and hemorrhoids appear nonthrombosed.  She has not tried  any over-the-counter medications.  Discussed adequate water intake and increasing her fiber intake.  Additionally, have recommended over-the-counter ointment Preparation H.  Follow-up if symptoms worsen or do not improve.  RUQ abdominal pain Chronic abdominal pain has resolved.  Right upper quadrant ultrasound recently revealed 3 mm polyp within the gallbladder, no additional imaging surveillance is warranted at this time. Lab work from 11/25 without evidence of transaminitis. Continue to monitor  Health maintenance -Pap smear performed today   Freddrick MarchYashika Lynix Bonine, MD [facility]

## 2018-01-31 NOTE — Assessment & Plan Note (Signed)
External hemorrhoids noted on physical exam.  Patient has noticed blood on toilet tissue on several occurrences following constipation.  No signs of active bleeding at this time and hemorrhoids appear nonthrombosed.  She has not tried any over-the-counter medications.  Discussed adequate water intake and increasing her fiber intake.  Additionally, have recommended over-the-counter ointment Preparation H.  Follow-up if symptoms worsen or do not improve.

## 2018-02-03 LAB — CYTOLOGY - PAP
DIAGNOSIS: NEGATIVE
HPV: NOT DETECTED

## 2018-03-23 ENCOUNTER — Other Ambulatory Visit: Payer: Self-pay

## 2018-03-23 ENCOUNTER — Encounter: Payer: Self-pay | Admitting: Family Medicine

## 2018-03-23 ENCOUNTER — Ambulatory Visit (INDEPENDENT_AMBULATORY_CARE_PROVIDER_SITE_OTHER): Payer: Self-pay | Admitting: Family Medicine

## 2018-03-23 VITALS — BP 92/60 | HR 78 | Temp 98.5°F | Ht 62.0 in | Wt 151.4 lb

## 2018-03-23 DIAGNOSIS — R079 Chest pain, unspecified: Secondary | ICD-10-CM | POA: Insufficient documentation

## 2018-03-23 DIAGNOSIS — M7989 Other specified soft tissue disorders: Secondary | ICD-10-CM

## 2018-03-23 DIAGNOSIS — I82402 Acute embolism and thrombosis of unspecified deep veins of left lower extremity: Secondary | ICD-10-CM

## 2018-03-23 NOTE — Progress Notes (Signed)
   Subjective:   Patient ID: Kristen Davila    DOB: [birth year], 38 y.o. female   MRN: [id number]  CC: chest pain, questions re: anticoagulation    HPI: Kristen Davila is a 38 y.o. female who presents to clinic today for the following issues.  Left chest pain  Started about 1 month ago, mild in nature and feels dull. Comes and goes, does not radiate.  Has not tried any medication for this at home. Does not recall lifting anything heavy.  She endorses stressors at home which include separation from her boyfriend.  Also having some financial stress which may be contributing.  She is taking care of her three children by herself, the youngest is with a babysitter.  No SI/HI.  No SOB, palpitations or leg swelling.     History of initial provoked DVT   Was taking Xarelto for 3 months and wonders if she can discontinue it now.   Denies current SOB, leg swelling and calf pain.    ROS: No fever, chills, nausea, vomiting.  No calf swelling or SOB.   Social: pt is a never smoker.   Medications reviewed. Objective:   BP 92/60   Pulse 78   Temp 98.5 F (36.9 C) (Oral)   Ht 5\' 2"  (1.575 m)   Wt 151 lb 6.4 oz (68.7 kg)   SpO2 99%   BMI 27.69 kg/m  Vitals and nursing note reviewed.  General: 38 yo female, NAD  Neck: supple, normal ROM  CV: RRR no MRG, pain not reproducible on exam Lungs: CTAB, normal effort  Abdomen: soft, NTND, +bs  Skin: warm, dry, no rash Extremities: warm and well perfused, no edema or calf pain  Neuro: alert, oriented x3, no focal deficits   Assessment & Plan:   Chest pain x1 month dull in nature.  Not worse with exertion, low suspicion for cardiac etiology as normal exam and she does not have significant comorbidities.  Discussed that other possibilities include anxiety vs GERD vs MSK athough not reproducible on exam.  Do not suspect PE as underlying cause as vitals within normal range and not tachycardic.  Most likely is anxiety as she is  currently under a lot of stress in her home life and relationship. [facility] offered however she declines at this time.  -Will hold off on cards referral at this time -Reviewed breathing techniques and ways to relax  -can offer [facility] referral again if needed   Deep vein thrombosis (DVT) of left lower extremity (HCC) Has completed 3 mo of Xarelto for provoked DVT.  She may d/c Xarelto at this time.  Return precautions and red flags discussed - she expresses good understanding.   Follow up: 3 months   Freddrick March, MD [facility] PGY-3

## 2018-03-23 NOTE — Assessment & Plan Note (Addendum)
x1 month dull in nature.  Not worse with exertion, low suspicion for cardiac etiology as normal exam and she does not have significant comorbidities.  Discussed that other possibilities include anxiety vs GERD vs MSK athough not reproducible on exam.  Do not suspect PE as underlying cause as vitals within normal range and not tachycardic.  Most likely is anxiety as she is currently under a lot of stress in her home life and relationship. [facility] offered however she declines at this time.  -Will hold off on cards referral at this time -Reviewed breathing techniques and ways to relax  -can offer [facility] referral again if needed

## 2018-03-23 NOTE — Assessment & Plan Note (Signed)
Has completed 3 mo of Xarelto for provoked DVT.  She may d/c Xarelto at this time.  Return precautions and red flags discussed - she expresses good understanding.   Follow up: 3 months

## 2018-07-21 ENCOUNTER — Other Ambulatory Visit: Payer: Self-pay

## 2018-07-21 ENCOUNTER — Ambulatory Visit (INDEPENDENT_AMBULATORY_CARE_PROVIDER_SITE_OTHER): Payer: Self-pay | Admitting: Family Medicine

## 2018-07-21 ENCOUNTER — Encounter: Payer: Self-pay | Admitting: Family Medicine

## 2018-07-21 VITALS — BP 100/60 | HR 4

## 2018-07-21 DIAGNOSIS — N949 Unspecified condition associated with female genital organs and menstrual cycle: Secondary | ICD-10-CM

## 2018-07-21 DIAGNOSIS — N898 Other specified noninflammatory disorders of vagina: Secondary | ICD-10-CM | POA: Insufficient documentation

## 2018-07-21 LAB — POCT WET PREP (WET MOUNT)
Clue Cells Wet Prep Whiff POC: NEGATIVE
Trichomonas Wet Prep HPF POC: ABSENT

## 2018-07-21 MED ORDER — FLUCONAZOLE 150 MG PO TABS: 150.0000 mg | ORAL_TABLET | Freq: Once | ORAL | 0 refills | Status: AC

## 2018-07-21 NOTE — Progress Notes (Signed)
   Subjective:   Patient ID: Kristen Davila    DOB: Mar 14, 1980, 38 y.o. female   MRN: [id number]  CC: vaginal infection  HPI: Kristen Davila is a 38 y.o. female who presents to clinic today for the following issue.   Vaginal burning For 1 week burning in her vaginal area and has felt a "pimple" inside as well as outside.   Pain and irritation associated with wiping and she has noticed an odor that is out of the normal.  She has had some vaginal discharge which is white and thick.  She is married and is exclusively sexually active with her partner.  She has not had h/o herpes in the past.  She has has an STD in the past when she lived in [place] which was treated with resolution of her symptoms.  She denies dysuria, frequency and urgency.  No cold sores on her lips.  She denies fever, chills, abdominal pain, nausea, vomiting.     ROS: See HPI for pertinent ROS. Social: pt is a never smoker.   Medications reviewed. Objective:   BP 100/60   Pulse (!) 4   LMP 07/07/2018 (Approximate)   SpO2 99%  Vitals and nursing note reviewed.  General: 38 yo female, NAD  Neck: supple CV: RRR no MRG  Lungs: CTAB, normal effort  Abdomen: soft, +bs  Pelvic exam: VULVA: normal appearing vulva with no masses or tenderness.  2 small fleshy-appearing wart-like lesions- one located on right superior aspect of labia majora and another on left labia minor, non-erythematous and no drainage or crusting noted.  VAGINA: normal appearing vagina with normal color and discharge, no lesions, CERVIX: normal appearing cervix without discharge or lesions. 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 Reassurance provided.  Patient does not desire STD testing.  Will order wet prep to test for yeast infection given itching and h/o thick white discharge and odor.   -Wet prep  -Declined GC   Orders Placed This Encounter  Procedures  . POCT Wet Prep [facility])    Freddrick March, MD [facility] PGY-3 07/21/2018 5:14 PM

## 2018-07-21 NOTE — Assessment & Plan Note (Addendum)
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 Reassurance provided.  Patient does not desire STD testing.  Will order wet prep to test for yeast infection given itching and h/o thick white discharge and odor.  -Wet prep  -Declined GC

## 2019-09-19 ENCOUNTER — Emergency Department (HOSPITAL_COMMUNITY)
Admission: EM | Admit: 2019-09-19 | Discharge: 2019-09-19 | Disposition: A | Discharge status: Home / Self Care | Payer: Self-pay | Attending: Emergency Medicine | Admitting: Emergency Medicine

## 2019-09-19 ENCOUNTER — Other Ambulatory Visit: Payer: Self-pay

## 2019-09-19 ENCOUNTER — Encounter (HOSPITAL_COMMUNITY): Payer: Self-pay | Admitting: *Deleted

## 2019-09-19 DIAGNOSIS — R109 Unspecified abdominal pain: Secondary | ICD-10-CM | POA: Insufficient documentation

## 2019-09-19 DIAGNOSIS — Z5321 Procedure and treatment not carried out due to patient leaving prior to being seen by health care provider: Secondary | ICD-10-CM | POA: Insufficient documentation

## 2019-09-19 DIAGNOSIS — R509 Fever, unspecified: Secondary | ICD-10-CM | POA: Insufficient documentation

## 2019-09-19 DIAGNOSIS — M545 Low back pain: Secondary | ICD-10-CM | POA: Insufficient documentation

## 2019-09-19 DIAGNOSIS — R1110 Vomiting, unspecified: Secondary | ICD-10-CM | POA: Insufficient documentation

## 2019-09-19 DIAGNOSIS — R197 Diarrhea, unspecified: Secondary | ICD-10-CM | POA: Insufficient documentation

## 2019-09-19 LAB — COMPREHENSIVE METABOLIC PANEL
ALT: 22 U/L (ref 0–44)
AST: 24 U/L (ref 15–41)
Albumin: 4.1 g/dL (ref 3.5–5.0)
Alkaline Phosphatase: 48 U/L (ref 38–126)
Anion gap: 9 (ref 5–15)
BUN: 13 mg/dL (ref 6–20)
CO2: 23 mmol/L (ref 22–32)
Calcium: 9.2 mg/dL (ref 8.9–10.3)
Chloride: 105 mmol/L (ref 98–111)
Creatinine, Ser: 0.73 mg/dL (ref 0.44–1.00)
GFR calc Af Amer: >60 mL/min (ref >60)
GFR calc non Af Amer: >60 mL/min (ref >60)
Glucose, Bld: 118 mg/dL — ABNORMAL HIGH (ref 70–99)
Potassium: 3.6 mmol/L (ref 3.5–5.1)
Sodium: 137 mmol/L (ref 135–145)
Total Bilirubin: 0.8 mg/dL (ref 0.3–1.2)
Total Protein: 7.2 g/dL (ref 6.5–8.1)

## 2019-09-19 LAB — URINALYSIS, ROUTINE W REFLEX MICROSCOPIC
Bilirubin Urine: NEGATIVE
Glucose, UA: NEGATIVE mg/dL
Hgb urine dipstick: NEGATIVE
Ketones, ur: NEGATIVE mg/dL
Nitrite: NEGATIVE
Protein, ur: NEGATIVE mg/dL
Specific Gravity, Urine: 1.018 (ref 1.005–1.030)
pH: 7 (ref 5.0–8.0)

## 2019-09-19 LAB — I-STAT BETA HCG BLOOD, ED (MC, WL, AP ONLY): I-stat hCG, quantitative: <5 m[IU]/mL (ref <5)

## 2019-09-19 LAB — CBC
HCT: 44.3 % (ref 36.0–46.0)
Hemoglobin: 14 g/dL (ref 12.0–15.0)
MCH: 25.5 pg — ABNORMAL LOW (ref 26.0–34.0)
MCHC: 31.6 g/dL (ref 30.0–36.0)
MCV: 80.5 fL (ref 80.0–100.0)
Platelets: 188 10*3/uL (ref 150–400)
RBC: 5.5 MIL/uL — ABNORMAL HIGH (ref 3.87–5.11)
RDW: 13 % (ref 11.5–15.5)
WBC: 6.1 10*3/uL (ref 4.0–10.5)
nRBC: 0 % (ref 0.0–0.2)

## 2019-09-19 LAB — LIPASE, BLOOD: Lipase: 32 U/L (ref 11–51)

## 2019-09-19 MED ORDER — SODIUM CHLORIDE 0.9% FLUSH: 3.0000 mL | Freq: Once | INTRAVENOUS | Status: DC

## 2019-09-19 NOTE — ED Triage Notes (Signed)
Right sided flank pain that radiates to the abdomen and back area. Vomiting, diarrhea and fevers. No urinary symptoms.

## 2019-09-19 NOTE — ED Notes (Signed)
Pt stated they wanted to leave due to wait time, we encouraged her to stay, but she insisted on leaving, pt is OTF.

## 2019-09-21 ENCOUNTER — Ambulatory Visit: Payer: Self-pay

## 2019-09-25 ENCOUNTER — Ambulatory Visit: Payer: Self-pay

## 2019-09-26 ENCOUNTER — Ambulatory Visit (INDEPENDENT_AMBULATORY_CARE_PROVIDER_SITE_OTHER): Payer: Self-pay | Admitting: Family Medicine

## 2019-09-26 ENCOUNTER — Other Ambulatory Visit: Payer: Self-pay

## 2019-09-26 ENCOUNTER — Encounter: Payer: Self-pay | Admitting: Family Medicine

## 2019-09-26 VITALS — BP 94/60 | HR 68 | Ht 62.0 in | Wt 156.1 lb

## 2019-09-26 DIAGNOSIS — R1011 Right upper quadrant pain: Secondary | ICD-10-CM

## 2019-09-26 DIAGNOSIS — N644 Mastodynia: Secondary | ICD-10-CM

## 2019-09-26 NOTE — Patient Instructions (Addendum)
Stomach pain: I am not positive what is causing your stomach pain. It does sound like it might be pain from your gallbladder. I think it is reasonable to get an ultrasound of your gallbladder at this time to make sure there are not stones. If there are stones, we will talk to surgery. If there are not stones, we do not have to do anything for now. Even if there are not stones, if you continue to have frequent episodes of abdominal pain, it may be a good idea to talk to surgery anyway.  Breast pain: There is no palpable breast mass on your exam today. I do not think that there is anything concerning causing your breast pain. I will try to figure out the best way to get you an inexpensive mammogram and call you later today.  Dolor de Teaching laboratory technician: No estoy seguro de qu est causando su dolor de [place]. Parece que podra ser un dolor de la vescula biliar. Creo que es razonable hacerse una ecografa de la vescula biliar en este momento para asegurarse de que no haya clculos. Si hay piedras, hablaremos con ciruga. Si no hay piedras, no tenemos que hacer nada por ahora. Incluso si no hay clculos, si contina teniendo episodios frecuentes de dolor abdominal, puede ser Neomia Dear buena idea hablar con la ciruga de todos modos.  Dolor en los senos: no hay una masa palpable en los senos en su examen de hoy. No creo que haya nada relacionado con causarle dolor en los senos. Intentar encontrar la mejor manera de conseguirle una mamografa econmica y la llamar hoy ms tarde.

## 2019-09-26 NOTE — Assessment & Plan Note (Signed)
The history provided is highly suspicious for gallbladder related pathology although multiple ultrasounds have failed to demonstrate stones.  Today, we will move forward with a right upper quadrant ultrasound to ensure no stones are present for this episode.  If stones are present, will refer to surgery for cholecystectomy.  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  Per up-to-date, it appears that roughly 50% of these cases will improve spontaneously.  Based on her history for the past 5 years or so, it seems like she has occasional recurrence of these episodes without total improvement.  She was informed that she may have a functional gallbladder disorder and surgery could be considered even without evidence of stones.  She understands that surgery may not be necessary strictly speaking but might be a treatment option for her. -Follow-up ultrasound

## 2019-09-26 NOTE — Assessment & Plan Note (Signed)
No concerning findings on physical exam.  Very low suspicion for breast mass breast cancer.  She is informed this is likely musculoskeletal and does not require an isolated work-up.  She was encouraged to begin breast cancer screening with mammography.  She was interested in the screening but did not want to have to pay out-of-pocket. -Message sent to [facility] regarding payment options for mammography. -Screening mammography order placed

## 2019-09-26 NOTE — Progress Notes (Signed)
SUBJECTIVE:   CHIEF COMPLAINT / HPI:   Breast discomfort She has noticed some mild dull pain to the side of her left breast for roughly the past year.  This discomfort seems to come and go.  She reports that it feels like a dull pressure or mild discomfort.  Ms. seems to have worsened in the past several weeks so she came in to be assessed.  She has not noticed any lumps or bumps or skin changes in the area that she is concerned about.  Right upper quadrant pain She has a history of right upper stomach pain extending back for at least 5 years.  She occasionally notices severe right-sided abdominal pain that she associates with meals and especially with greasy food.  She has been seen in urgent care or the emergency room several times in the past 5 years and has had 2 ultrasounds performed.  There has never been any evidence of stones although a 3 mm polyp was detected on both ultrasounds.  Recently, she was seen in the emergency room on 7/27 for a recurrence of this pain.  At that time, her liver and kidney function was entirely normal and the lipase was also within the normal limits.  She was discharged from the emergency room at that time without further work-up or ultrasound.  Since her assessment in the emergency room last week, her discomfort has significantly improved and she now has only a very mild right upper stomach discomfort.  On further discussion, it seems that this kind of pain is problematic quite frequently and can occur as often as every month.  Typically, she notes that it occurs with fatty or greasy foods and it is not usually severe enough to make her go to the emergency room.  She notes that she feels that problem has never totally resolved.    PERTINENT  PMH / PSH: History of right upper quadrant abdominal pain  OBJECTIVE:   BP 94/60   Pulse 68   Ht 5\' 2"  (1.575 m)   Wt 156 lb 2 oz (70.8 kg)   LMP 08/31/2019   SpO2 99%   BMI 28.56 kg/m    General:  Well-appearing middle-aged woman in no acute distress.  Seated comfortably on the exam table. Respiratory: Breathing comfortably on room air.  No respiratory distress. Breast exam: No gross asymmetry noted between breasts.  Exam of left and right breast was not entirely normal without palpation of any nodules.  No nodules were appreciated in the axilla or along the midaxillary line.  No skin changes were noted.  The specific area that it caused her mild discomfort was pointed out and no evidence of nodules or significant tenderness was found. Abdomen: Soft, nontender to palpation, bowel sounds normal.  No significant tenderness over the right upper quadrant. Extremities: No edema.  Warm, dry.  ASSESSMENT/PLAN:   RUQ abdominal pain The history provided is highly suspicious for gallbladder related pathology although multiple ultrasounds have failed to demonstrate stones.  Today, we will move forward with a right upper quadrant ultrasound to ensure no stones are present for this episode.  If stones are present, will refer to surgery for cholecystectomy.  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  Per up-to-date, it appears that roughly 50% of these cases will improve spontaneously.  Based on her history for the past 5 years or so, it  seems like she has occasional recurrence of these episodes without total improvement.  She was informed that she may have a functional gallbladder disorder and surgery could be considered even without evidence of stones.  She understands that surgery may not be necessary strictly speaking but might be a treatment option for her. -Follow-up ultrasound  Breast pain No concerning findings on physical exam.  Very low suspicion for breast mass breast cancer.  She is informed this is likely musculoskeletal and does not require an isolated work-up.  She was encouraged to  begin breast cancer screening with mammography.  She was interested in the screening but did not want to have to pay out-of-pocket. -Message sent to [facility] regarding payment options for mammography. -Screening mammography order placed     Mirian Mo, MD [facility]

## 2019-09-27 ENCOUNTER — Telehealth: Payer: Self-pay | Admitting: *Deleted

## 2019-09-27 NOTE — Telephone Encounter (Signed)
-----   Message from Mirian Mo, MD sent at 09/26/2019  5:55 PM EDT ----- Thank you for your help today.  This was at the woman who wanted to get a screening mammogram but does not have any insurance.  Please let me know what the next steps are to help her get coverage.  Thank you.Mirian Mo, MD

## 2019-09-27 NOTE — Telephone Encounter (Signed)
Staff message sent to Festus Barren and Wardell Heath with [facility] for mammogram screenings.  They will contact the patient and assist her in getting the mammogram scheduled.  Sejla Marzano,CMA

## 2019-09-29 ENCOUNTER — Other Ambulatory Visit: Payer: Self-pay

## 2019-09-29 DIAGNOSIS — N644 Mastodynia: Secondary | ICD-10-CM

## 2019-10-31 ENCOUNTER — Ambulatory Visit
Admission: RE | Admit: 2019-10-31 | Discharge: 2019-10-31 | Disposition: A | Discharge status: Home / Self Care | Payer: No Typology Code available for payment source | Source: Ambulatory Visit | Attending: Obstetrics and Gynecology | Admitting: Obstetrics and Gynecology

## 2019-10-31 ENCOUNTER — Other Ambulatory Visit: Payer: Self-pay

## 2019-10-31 ENCOUNTER — Ambulatory Visit: Payer: Self-pay | Admitting: *Deleted

## 2019-10-31 VITALS — BP 100/64 | Temp 97.1°F | Wt 154.6 lb

## 2019-10-31 DIAGNOSIS — N644 Mastodynia: Secondary | ICD-10-CM

## 2019-10-31 DIAGNOSIS — Z1239 Encounter for other screening for malignant neoplasm of breast: Secondary | ICD-10-CM

## 2019-10-31 NOTE — Patient Instructions (Signed)
Explained breast self awareness with Kristen Davila. Patient did not need a Pap smear today due to last Pap smear and HPV typing was 01/31/2018. Let her know BCCCP will cover Pap smears and HPV typing every 5 years unless has a history of abnormal Pap smears. Referred patient to the [facility] Center of [facility] for a diagnostic mammogram. Appointment scheduled Tuesday, October 31, 2019 at 0940. Patient aware of appointment and will be there. Kristen Davila verbalized understanding.  Mikolaj Woolstenhulme, Kathaleen Maser, RN 8:25 AM

## 2019-10-31 NOTE — Progress Notes (Addendum)
[name] is a 39 y.o. female who presents to [facility] clinic today with complaint of left outer breast pain x one year that comes and goes. Patient rates the pain at a 5 out of 10..    Pap Smear: Pap smear not completed today. Last Pap smear was 01/31/2018 at the [facility] clinic and was normal with negative HPV. Per patient has no history of an abnormal Pap smear. Last Pap smear result is available in Epic.   Physical exam: Breasts Breasts symmetrical. No skin abnormalities bilateral breasts. No nipple retraction bilateral breasts. No nipple discharge bilateral breasts. No lymphadenopathy. No lumps palpated bilateral breasts. Patient complained of mild left outer breast tenderness on exam at 3 o'clock.       Pelvic/Bimanual Pap is not indicated today per BCCCP guidelines.    Smoking History: Patient has never smoked.   Patient Navigation: Patient education provided. Access to services provided for patient through [employer] program. Used Spanish interpreter [name] from [facility] provided.    Breast and Cervical Cancer Risk Assessment: Patient does not have family history of breast cancer, known genetic mutations, or radiation treatment to the chest before age 10. Patient does not have history of cervical dysplasia, immunocompromised, or DES exposure in-utero.  Risk Assessment    Risk Scores      10/31/2019   Last edited by: Narda Rutherford, LPN   5-year risk: 0.3 %   Lifetime risk: 5.2 %          A: BCCCP exam without pap smear Complaints of left outer breast pain.  P: Referred patient to the [facility] Center of [facility] for a diagnostic mammogram. Appointment scheduled Tuesday, October 31, 2019 at 0940.  Priscille Heidelberg, RN 10/31/2019 8:27 AM

## 2019-11-01 ENCOUNTER — Other Ambulatory Visit: Payer: Self-pay

## 2019-11-02 ENCOUNTER — Other Ambulatory Visit: Payer: Self-pay

## 2019-11-03 ENCOUNTER — Other Ambulatory Visit: Payer: Self-pay

## 2019-11-17 ENCOUNTER — Ambulatory Visit
Admission: RE | Admit: 2019-11-17 | Discharge: 2019-11-17 | Disposition: A | Discharge status: Home / Self Care | Payer: No Typology Code available for payment source | Source: Ambulatory Visit | Attending: Family Medicine | Admitting: Family Medicine

## 2019-11-17 DIAGNOSIS — R1011 Right upper quadrant pain: Secondary | ICD-10-CM

## 2019-11-20 ENCOUNTER — Other Ambulatory Visit: Payer: Self-pay | Admitting: Family Medicine

## 2019-11-20 DIAGNOSIS — R1011 Right upper quadrant pain: Secondary | ICD-10-CM

## 2019-11-20 NOTE — Progress Notes (Signed)
All of the numbers in her chart were attempted without successfully reaching the patient.  I have placed a referral to general surgery based on her ultrasound demonstrating gallstones.  If she calls the clinic, please inform her that a referral to general surgery has been placed for her to discuss removing her gallbladder.  I will be happy to answer any additional questions if she calls back.  Mirian Mo, MD

## 2019-11-21 ENCOUNTER — Telehealth: Payer: Self-pay

## 2019-11-21 NOTE — Telephone Encounter (Signed)
Patient calls PCP back in regards to message below. Patient requests a spanish interpreter. I called right back with one, and unfortunately, the line rang busy X2. Will try again later.

## 2019-11-23 NOTE — Telephone Encounter (Signed)
Attempted to call patient again, however no answer.

## 2019-12-22 ENCOUNTER — Ambulatory Visit: Payer: Self-pay | Admitting: General Surgery

## 2019-12-22 NOTE — H&P (Signed)
  History of Present Illness Axel Filler MD; 12/22/2019 9:55 AM) The patient is a 39 year old female who presents for evaluation of gall stones. Chief Complaint: Gallstones  Patient is a 39 year old female, Spanish-speaking, with a history of right upper quadrant, epigastric abdominal pain. Patient was recently ER secondary to epigastric abdominal pain, nausea and vomiting. Patient states that the patient's been going on for approximately a year patient states his become more and more frequent. She states that she's had pain after eating high fatty foods.  Upon evaluation in regards to her laboratory studies and ultrasound. Ultrasound significant for cholelithiasis, no cholecystitis. Laboratory studies were within normal limits. Patient had normal LFTs  Patient's had no previous abdominal surgery.    Duration: Location: Timing: Severity: Modifying factors:   Allergies (Tanisha A. Manson Passey, RMA; 12/22/2019 9:40 AM) No Known Drug Allergies  [12/22/2019]: Allergies Reconciled   Medication History (Tanisha A. Manson Passey, RMA; 12/22/2019 9:40 AM) No Current Medications Medications Reconciled    Review of Systems Axel Filler, MD; 12/22/2019 9:57 AM) General Present- Feeling well. Not Present- Fever. Respiratory Not Present- Cough and Difficulty Breathing. Cardiovascular Not Present- Chest Pain. Gastrointestinal Present- Abdominal Pain and Nausea. Musculoskeletal Not Present- Myalgia. Neurological Not Present- Weakness. All other systems negative  Vitals (Tanisha A. Brown RMA; 12/22/2019 9:40 AM) 12/22/2019 9:40 AM Weight: 154.6 lb Height: 62in Body Surface Area: 1.71 m Body Mass Index: 28.28 kg/m  Temp.: 98.80F  Pulse: 78 (Regular)  BP: 126/86(Sitting, Left Arm, Standard)       Physical Exam Axel Filler MD; 12/22/2019 9:55 AM) The physical exam findings are as follows: Note: Constitutional: No acute distress, conversant, appears stated  age  Eyes: Anicteric sclerae, moist conjunctiva, no lid lag  Neck: No thyromegaly, trachea midline, no cervical lymphadenopathy  Lungs: Clear to auscultation biilaterally, normal respiratory effot  Cardiovascular: regular rate & rhythm, no murmurs, no peripheal edema, pedal pulses 2+  GI: Soft, no masses or hepatosplenomegaly, non-tender to palpation  MSK: Normal gait, no clubbing cyanosis, edema  Skin: No rashes, palpation reveals normal skin turgor  Psychiatric: Appropriate judgment and insight, oriented to person, place, and time    Assessment & Plan Axel Filler MD; 12/22/2019 9:56 AM) SYMPTOMATIC CHOLELITHIASIS (K80.20) Impression: Patient is a 39 year old female, with a history of symptomatic cholelithiasis  1. We will proceed to the operating room for a laparoscopic cholecystectomy  2. Risks and benefits were discussed with the patient to generally include, but not limited to: infection, bleeding, possible need for post op ERCP, damage to the bile ducts, bile leak, and possible need for further surgery. Alternatives were offered and described. All questions were answered and the patient voiced understanding of the procedure and wishes to proceed at this point with a laparoscopic cholecystectomy

## 2020-09-02 ENCOUNTER — Ambulatory Visit: Payer: No Typology Code available for payment source | Admitting: Family Medicine

## 2020-09-25 ENCOUNTER — Other Ambulatory Visit: Payer: Self-pay

## 2020-09-25 ENCOUNTER — Inpatient Hospital Stay: Payer: Self-pay | Attending: Obstetrics and Gynecology | Admitting: *Deleted

## 2020-09-25 VITALS — BP 104/72 | Ht 62.0 in | Wt 153.4 lb

## 2020-09-25 DIAGNOSIS — Z1231 Encounter for screening mammogram for malignant neoplasm of breast: Secondary | ICD-10-CM

## 2020-09-25 DIAGNOSIS — Z0000 Encounter for general adult medical examination without abnormal findings: Secondary | ICD-10-CM

## 2020-09-25 NOTE — Progress Notes (Signed)
[facility] initial screening   Interpreter- Alene Mires, Haroldine Laws   Clinical Measurement:  Vitals:   09/25/20 0838  BP: 110/74   Fasting Labs Drawn Today, will review with patient when they result.   Medical History:  Patient states that she does not have high cholesterol, does not have high blood pressure and she does not have diabetes.  Medications:  Patient states that she does not take medication to lower cholesterol, blood pressure or blood sugar.  Patient does not take an aspirin a day to help prevent a heart attack or stroke.    Blood pressure, self measurement:  Patient states that she does not measure blood pressure from home. She checks her blood pressure N/A. She shares her readings with a health care provider: N/A.   Nutrition: Patient states that on average she eats 1 cups of fruit and 1 cups of vegetables per day. Patient states that she does not eat fish at least 2 times per week. Patient eats less than half servings of whole grains. Patient drinks less than 36 ounces of beverages with added sugar weekly: yes. Patient is currently watching sodium or salt intake: yes. In the past 7 days patient has consumed drinks containing alcohol on 1 days. On a day that patient consumes drinks containing alcohol on average 1 drinks are consumed.      Physical activity:  Patient states that she gets 60 minutes of moderate and 60 minutes of vigorous physical activity each week.  Smoking status:  Patient states that she has has never smoked .   Quality of life:  Over the past 2 weeks patient states that she had little interest or pleasure in doing things: not at all. She has been feeling down, depressed or hopeless:not at all.    Risk reduction and counseling:   Health Coaching: Spoke with patient about the daily recommendation for fruits and vegetables (2 cups of fruit and 3 cups of vegetables). Patient eats a serving of fish every other week. Gave recommendations for heart healthy fish that  patient can add into diet (salmon, tuna, mackerel, sardines, sea bass or trout). Patient eats whole grain bread but no other whole grains. Gave recommendations for other whole grains that she can add into diet (whole wheat pasta, brown rice, whole grain cereals). Patient currently works out 2 days a week for an hour at a time. Spoke with patient about adding in an extra day of exercising to meet exercise recommendations.     Navigation:  I will notify patient of lab results.  Patient is aware of 2 more health coaching sessions and a follow up.  Time: 25 minutes

## 2020-09-26 LAB — HEMOGLOBIN A1C
Est. average glucose Bld gHb Est-mCnc: 97 mg/dL
Hgb A1c MFr Bld: 5 % (ref 4.8–5.6)

## 2020-09-26 LAB — LIPID PANEL
Chol/HDL Ratio: 3.1 ratio (ref 0.0–4.4)
Cholesterol, Total: 152 mg/dL (ref 100–199)
HDL: 49 mg/dL (ref >39)
LDL Chol Calc (NIH): 85 mg/dL (ref 0–99)
Triglycerides: 98 mg/dL (ref 0–149)
VLDL Cholesterol Cal: 18 mg/dL (ref 5–40)

## 2020-09-26 LAB — GLUCOSE, RANDOM: Glucose: 80 mg/dL (ref 65–99)

## 2020-10-01 ENCOUNTER — Telehealth: Payer: Self-pay

## 2020-10-01 NOTE — Telephone Encounter (Signed)
Health coaching 2   interpreter- [employer] [phone]   Labs- 152 cholesterol, 85 LDL cholesterol, 98 triglycerides, 49 HDL cholesterol, 5.0 hemoglobin A1C, 80 mean plasma glucose.   Patient understands and is aware of her lab results.   Goals-    Increase the amount of fruits and vegetables in diet. Increase the amount of heart healthy fish and whole grain foods.  Increase exercise to 3 days a week to meet weekly recommendations.    Navigation:  Patient is aware of 1 more health coaching sessions and a follow up.   Time-  10 minutes

## 2020-10-09 IMAGING — MG DIGITAL DIAGNOSTIC BILAT W/ TOMO W/ CAD
6 of 10 series · 6 of 30 positions shown · non-contrast
Comparison: None.

CLINICAL DATA: 39-year-old female presenting for baseline exam.
Patient reports focal intermittent left breast pain for
approximately 1 year.

EXAM:
DIGITAL DIAGNOSTIC BILATERAL MAMMOGRAM WITH CAD AND TOMO
ULTRASOUND LEFT BREAST

[L CC synth-2D (1 of 2)]
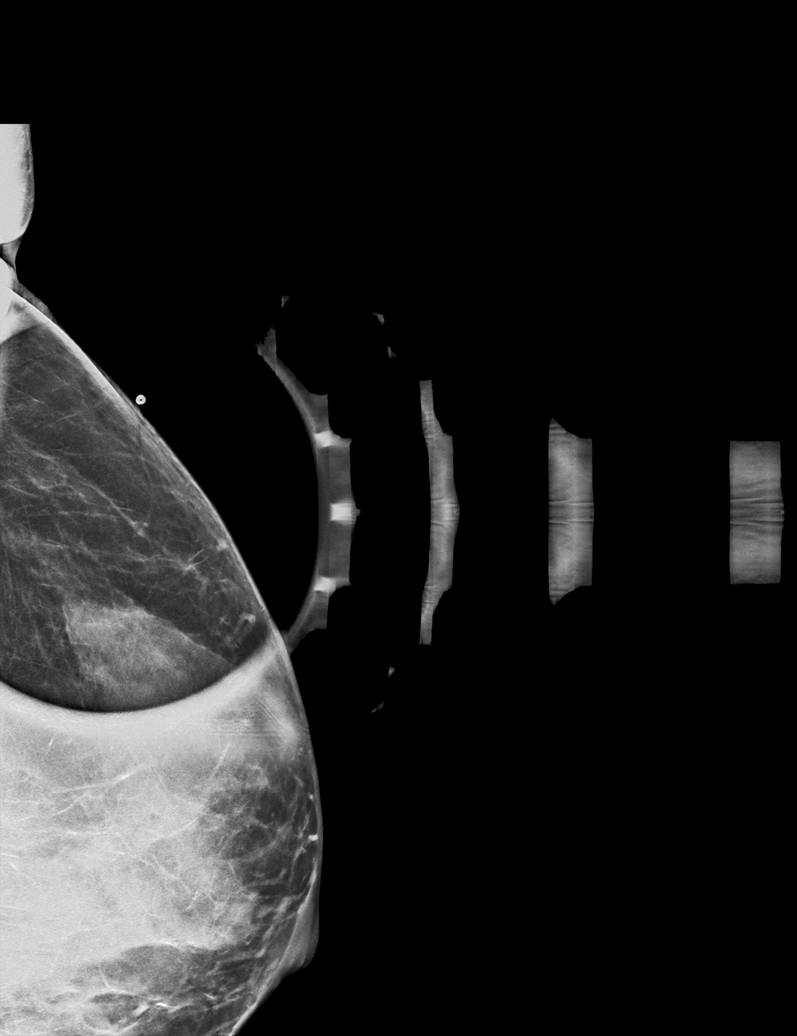

[L CC synth-2D (2 of 2)]
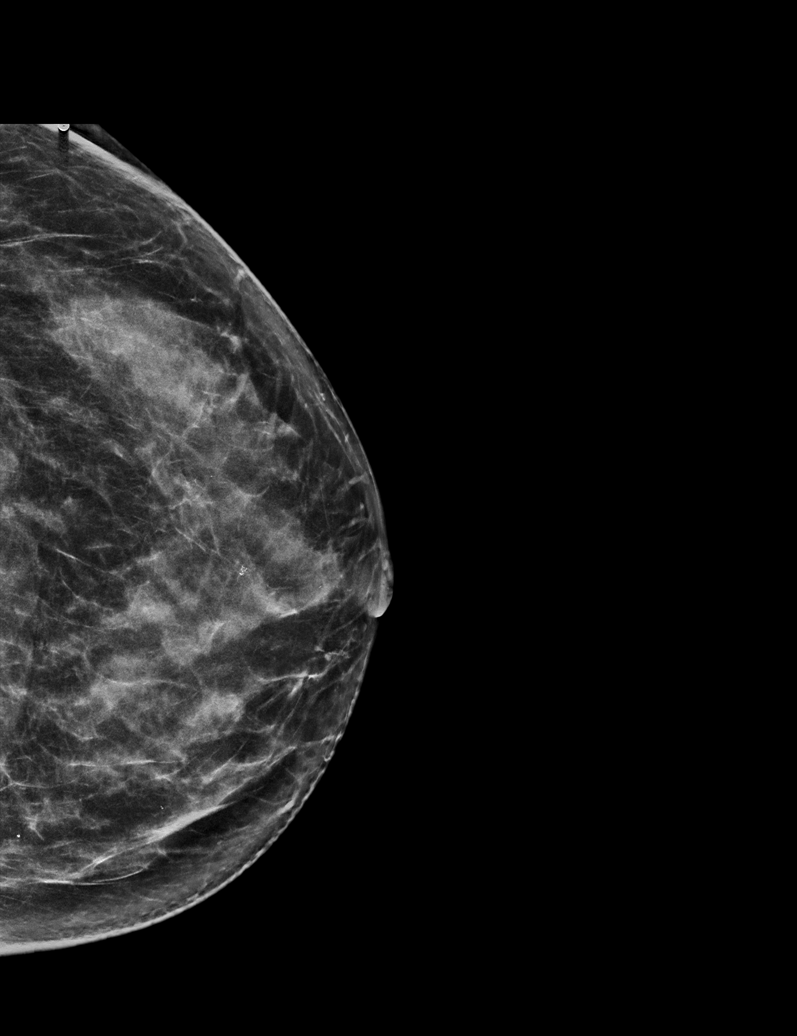

[R CC synth-2D]
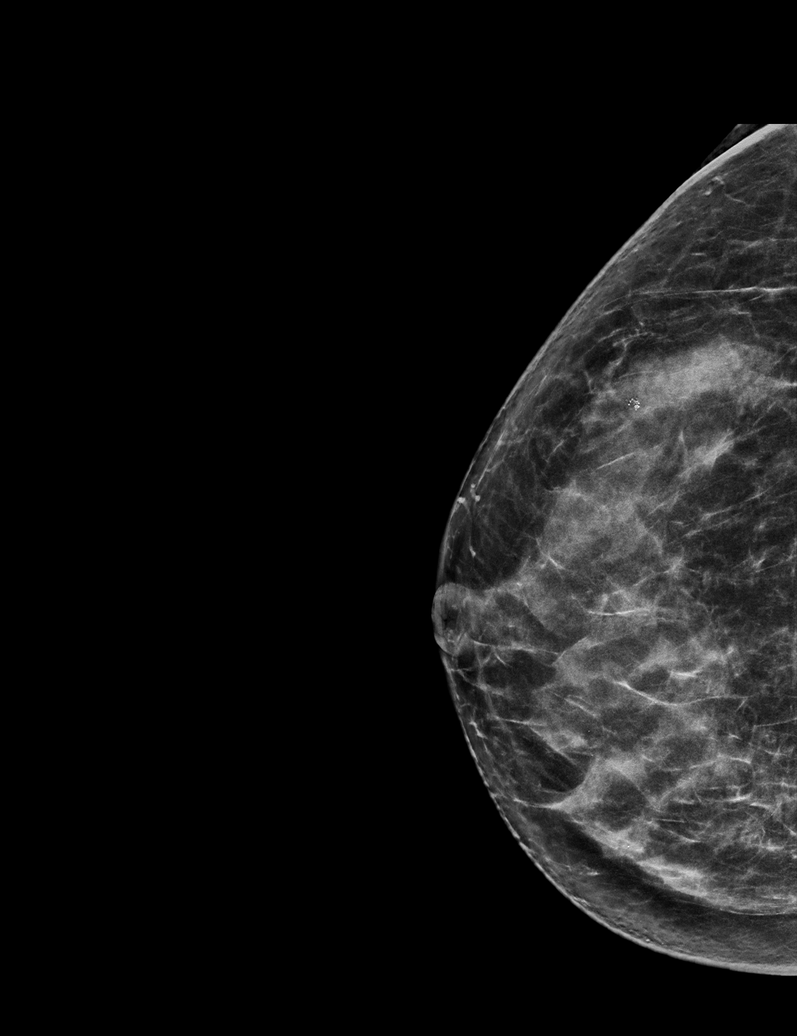

[L MLO synth-2D]
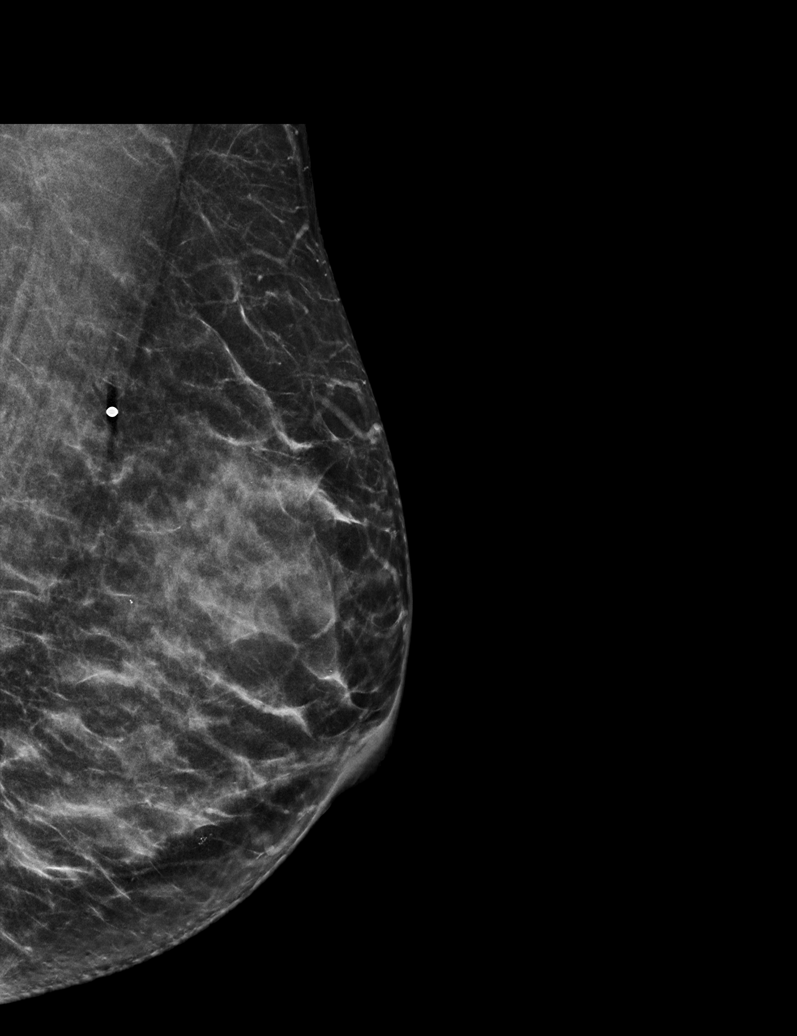

[R MLO synth-2D]
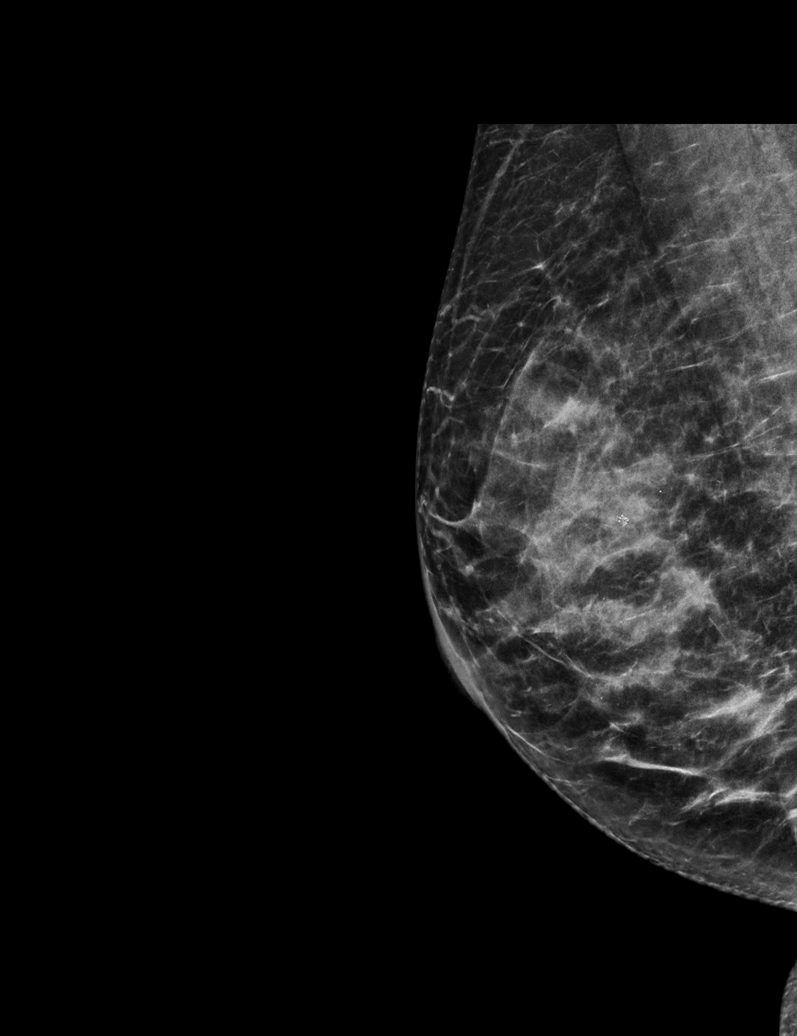

[L MLO tomo · tomo slice 29/56.0]
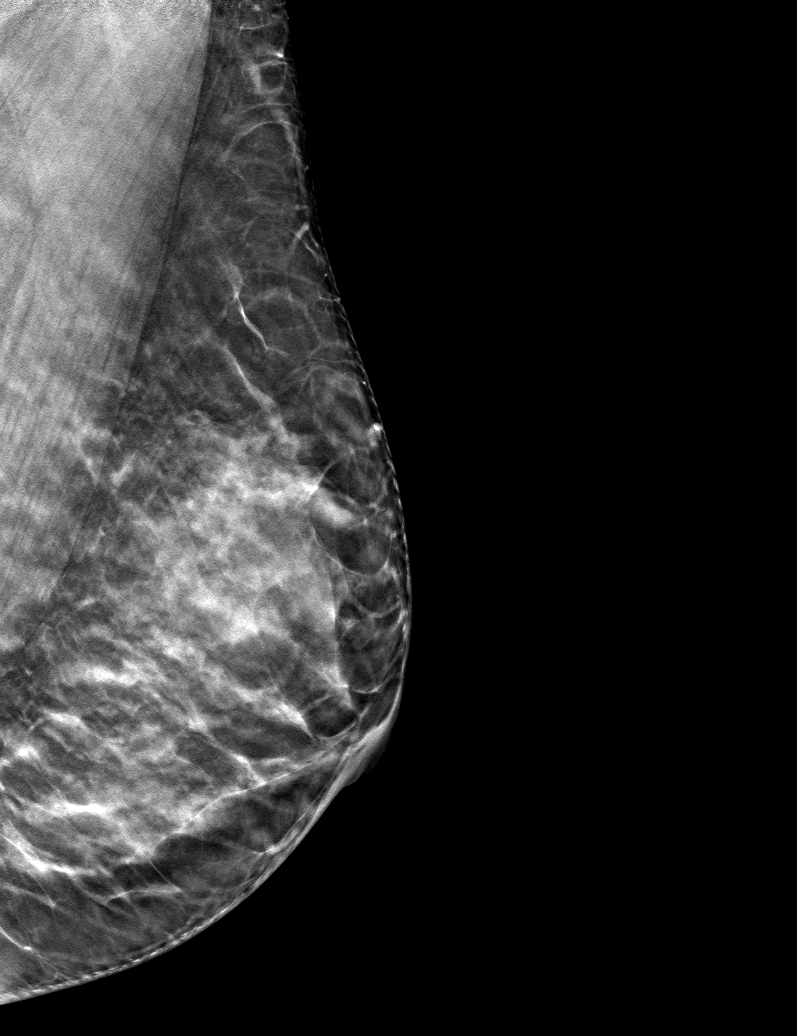

[6 of 30 positions shown; findings below may reference images not displayed]

ACR Breast Density Category c: The breast tissue is heterogeneously
dense, which may obscure small masses.
FINDINGS: Mammogram:

Right breast: No suspicious mass, distortion, or microcalcifications
are identified to suggest presence of malignancy.

Left breast: A skin BB marks the site of focal pain reported by the
patient in the upper outer left breast. Spot compression
tomosynthesis views were performed in addition to standard views.
There is no abnormality at the site of pain reported by the patient
or elsewhere in the left breast.

Mammographic images were processed with CAD.

On physical exam, I do not feel a discrete mass at the site of pain
reported by the patient. There are no overt skin changes.

Ultrasound:

Targeted ultrasound is performed at the site of pain reported by the
patient in the left breast at 3 o'clock 8 cm from the nipple
demonstrating no suspicious cystic or solid mass or other finding to
explain the patient's pain.
IMPRESSION: 1. No mammographic or sonographic evidence of malignancy or other
abnormality at the site of pain reported by the patient in the outer
left breast.

2.  No mammographic evidence of malignancy in the right breast.

RECOMMENDATION:
1. Clinical follow-up as needed for the left breast pain. We
discussed some of the issues regarding breast pain, including
limiting caffeine, wearing adequate support, over-the-counter pain
medication, low-fat diet, exercise, and ice as needed.

2.  Screening mammogram in one year.(Code:TI-A-LH3)

I have discussed the findings and recommendations with the patient.
If applicable, a reminder letter will be sent to the patient
regarding the next appointment.

BI-RADS CATEGORY  1: Negative.

## 2020-10-24 ENCOUNTER — Other Ambulatory Visit: Payer: Self-pay | Admitting: Obstetrics and Gynecology

## 2020-10-24 DIAGNOSIS — Z1231 Encounter for screening mammogram for malignant neoplasm of breast: Secondary | ICD-10-CM

## 2020-11-07 ENCOUNTER — Ambulatory Visit: Payer: Self-pay | Admitting: *Deleted

## 2020-11-07 ENCOUNTER — Other Ambulatory Visit: Payer: Self-pay

## 2020-11-07 ENCOUNTER — Ambulatory Visit
Admission: RE | Admit: 2020-11-07 | Discharge: 2020-11-07 | Disposition: A | Discharge status: Home / Self Care | Payer: No Typology Code available for payment source | Source: Ambulatory Visit | Attending: Obstetrics and Gynecology | Admitting: Obstetrics and Gynecology

## 2020-11-07 VITALS — BP 104/76 | Wt 152.0 lb

## 2020-11-07 DIAGNOSIS — Z1239 Encounter for other screening for malignant neoplasm of breast: Secondary | ICD-10-CM

## 2020-11-07 NOTE — Progress Notes (Signed)
Kristen Davila is a 40 y.o. female who presents to [facility] clinic today with no complaints.    Pap Smear: Pap smear not completed today. Last Pap smear was 01/31/2018 at the [facility] clinic and was normal with negative HPV. Per patient has no history of an abnormal Pap smear. Last Pap smear result is available in Epic.   Physical exam: Breasts Breasts symmetrical. No skin abnormalities bilateral breasts. No nipple retraction bilateral breasts. No nipple discharge bilateral breasts. No lymphadenopathy. No lumps palpated bilateral breasts. No complaints of pain or tenderness on exam.      MS DIGITAL DIAG TOMO BILAT  Result Date: 10/31/2019 CLINICAL DATA:  40 year old female presenting for baseline exam. Patient reports focal intermittent left breast pain for approximately 1 year. EXAM: DIGITAL DIAGNOSTIC BILATERAL MAMMOGRAM WITH CAD AND TOMO ULTRASOUND LEFT BREAST COMPARISON:  None. ACR Breast Density Category c: The breast tissue is heterogeneously dense, which may obscure small masses. FINDINGS: Mammogram: Right breast: No suspicious mass, distortion, or microcalcifications are identified to suggest presence of malignancy. Left breast: A skin BB marks the site of focal pain reported by the patient in the upper outer left breast. Spot compression tomosynthesis views were performed in addition to standard views. There is no abnormality at the site of pain reported by the patient or elsewhere in the left breast. Mammographic images were processed with CAD. On physical exam, I do not feel a discrete mass at the site of pain reported by the patient. There are no overt skin changes. Ultrasound: Targeted ultrasound is performed at the site of pain reported by the patient in the left breast at 3 o'clock 8 cm from the nipple demonstrating no suspicious cystic or solid mass or other finding to explain the patient's pain. IMPRESSION: 1. No mammographic or sonographic evidence of  malignancy or other abnormality at the site of pain reported by the patient in the outer left breast. 2.  No mammographic evidence of malignancy in the right breast. RECOMMENDATION: 1. Clinical follow-up as needed for the left breast pain. We discussed some of the issues regarding breast pain, including limiting caffeine, wearing adequate support, over-the-counter pain medication, low-fat diet, exercise, and ice as needed. 2.  Screening mammogram in one year.(Code:[id number]-B-01Y) I have discussed the findings and recommendations with the patient. If applicable, a reminder letter will be sent to the patient regarding the next appointment. BI-RADS CATEGORY  1: Negative. Electronically Signed   By: Emmaline Kluver M.D.   On: 10/31/2019 10:46    Pelvic/Bimanual Pap is not indicated today per BCCCP guidelines.   Smoking History: Patient has never smoked.   Patient Navigation: Patient education provided. Access to services provided for patient through [employer] program. Spanish interpreter [name] from [facility] provided.    Breast and Cervical Cancer Risk Assessment: Patient does not have family history of breast cancer, known genetic mutations, or radiation treatment to the chest before age 56. Patient does not have history of cervical dysplasia, immunocompromised, or DES exposure in-utero.  Risk Assessment     Risk Scores       11/07/2020 10/31/2019   Last edited by: Meryl Dare, CMA McGill, Sherie Demetrius Charity, LPN   5-year risk: 0.3 % 0.3 %   Lifetime risk: 5.1 % 5.2 %            A: BCCCP exam without pap smear No complaints.  P: Referred patient to the [facility] of [facility] for a screening mammogram on mobile unit. Appointment  scheduled Thursday, November 07, 2020 at 1530.  Priscille Heidelberg, RN 11/07/2020 3:21 PM

## 2020-11-07 NOTE — Patient Instructions (Signed)
Explained breast self awareness with Kristen Davila. Patient did not need a Pap smear today due to last Pap smear and HPV typing is 01/31/2018. Let her know BCCCP will cover Pap smears and HPV typing every 5 years unless has a history of abnormal Pap smears. Referred patient to the [facility] Center of [facility] for a screening mammogram on mobile unit. Appointment scheduled Thursday, November 07, 2020 at 1530. Patient escorted to the mobile unit following BCCCP appointment for her screening mammogram. Let patient know the Breast Center will follow up with her within the next couple weeks with results of her mammogram by letter or phone. Kristen Davila verbalized understanding.  Jeziel Hoffmann, Kathaleen Maser, RN 3:21 PM

## 2020-11-14 ENCOUNTER — Other Ambulatory Visit: Payer: Self-pay | Admitting: Obstetrics and Gynecology

## 2020-11-14 DIAGNOSIS — R928 Other abnormal and inconclusive findings on diagnostic imaging of breast: Secondary | ICD-10-CM

## 2020-11-29 ENCOUNTER — Ambulatory Visit: Payer: No Typology Code available for payment source

## 2020-11-29 ENCOUNTER — Other Ambulatory Visit: Payer: Self-pay

## 2020-11-29 ENCOUNTER — Ambulatory Visit
Admission: RE | Admit: 2020-11-29 | Discharge: 2020-11-29 | Disposition: A | Discharge status: Home / Self Care | Payer: No Typology Code available for payment source | Source: Ambulatory Visit | Attending: Obstetrics and Gynecology | Admitting: Obstetrics and Gynecology

## 2020-11-29 DIAGNOSIS — R928 Other abnormal and inconclusive findings on diagnostic imaging of breast: Secondary | ICD-10-CM

## 2021-03-28 ENCOUNTER — Encounter: Payer: Self-pay | Admitting: Family Medicine

## 2021-03-28 ENCOUNTER — Ambulatory Visit (INDEPENDENT_AMBULATORY_CARE_PROVIDER_SITE_OTHER): Payer: Self-pay | Admitting: Family Medicine

## 2021-03-28 ENCOUNTER — Other Ambulatory Visit: Payer: Self-pay

## 2021-03-28 DIAGNOSIS — Z86718 Personal history of other venous thrombosis and embolism: Secondary | ICD-10-CM

## 2021-03-28 NOTE — Patient Instructions (Addendum)
I do not think we need to do any x-rays at this time.  If your pain worsens or if your dizziness and blurry vision worsens you should return with [place].  For your concern of DVT we will check labs today.  I will call you if it is positive or I will send you a MyChart message if it is negative.  If it is negative we do not need to do anything else unless her symptoms change.  Your mammogram was actually checked with a diagnostic mammogram and was normal so you do not need one for 1 year.
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
## 2021-03-29 LAB — D-DIMER, QUANTITATIVE: D-DIMER: 1.19 mg/L FEU — ABNORMAL HIGH (ref 0.00–0.49)

## 2021-03-31 ENCOUNTER — Other Ambulatory Visit: Payer: Self-pay | Admitting: Family Medicine

## 2021-03-31 DIAGNOSIS — R7989 Other specified abnormal findings of blood chemistry: Secondary | ICD-10-CM

## 2021-04-01 NOTE — Progress Notes (Signed)
Spoke with patient through interpreter Cedric # [id number]. Informed of U/S on [facility]. At 4:00pm. Patient understood and wrote down information. Aquilla Solian, CMA

## 2021-04-02 ENCOUNTER — Telehealth: Payer: Self-pay

## 2021-04-02 ENCOUNTER — Ambulatory Visit (HOSPITAL_COMMUNITY)
Admission: RE | Admit: 2021-04-02 | Discharge: 2021-04-02 | Disposition: A | Discharge status: Home / Self Care | Payer: Self-pay | Source: Ambulatory Visit | Attending: Family Medicine | Admitting: Family Medicine

## 2021-04-02 ENCOUNTER — Other Ambulatory Visit: Payer: Self-pay

## 2021-04-02 DIAGNOSIS — R7989 Other specified abnormal findings of blood chemistry: Secondary | ICD-10-CM | POA: Insufficient documentation

## 2021-04-02 NOTE — Telephone Encounter (Signed)
Imaging calls nurse line to report negative DVT. Will forward to ordering provider.

## 2021-05-14 ENCOUNTER — Telehealth: Payer: Self-pay

## 2021-05-14 NOTE — Telephone Encounter (Signed)
Health Coaching 3 ? ?interpreter- [name], [employer] ?  ?Goals- Patient has been going to the gym twice a week for 1 hour. Patient does a combination of low-intensity and high-intensity workouts.  ?  ?New goal- Maintain heart healthy diet. Reducing the amount of fried and fatty foods consumed and trying to eat daily servings of fruit and vegetables. ? ?Barrier to reaching goal-  ?  ?Strategies to overcome-  ?  ?Navigation:  Patient is aware of  a follow up session. Patient is scheduled for follow-up appointment on Jul 02, 2021 @ 2:30 pm. ?  ?Time- 10 minutes ?

## 2021-07-02 ENCOUNTER — Inpatient Hospital Stay: Payer: Self-pay | Attending: Obstetrics and Gynecology | Admitting: *Deleted

## 2021-07-02 ENCOUNTER — Other Ambulatory Visit: Payer: Self-pay

## 2021-07-02 VITALS — BP 104/70 | Ht 62.0 in | Wt 155.8 lb

## 2021-07-02 DIAGNOSIS — Z1231 Encounter for screening mammogram for malignant neoplasm of breast: Secondary | ICD-10-CM

## 2021-07-02 DIAGNOSIS — Z0000 Encounter for general adult medical examination without abnormal findings: Secondary | ICD-10-CM

## 2021-07-02 NOTE — Progress Notes (Signed)
[facility] follow up ?  ?Interpreter: Haze Justin, [employer] ? ?Clinical Measurement:   ?Vitals:  ? 07/02/21 1439  ?BP: 110/70  ?  ?  ?Medical History:  Patient states that she does not have high cholesterol, does not have high blood pressure and she does not have diabetes. ? ?Medications:  Patient states that she does not take medication to lower cholesterol, blood pressure and blood sugar.  Patient does not take an aspirin a day to help prevent a heart attack or stroke.  ?  ?Blood pressure, self measurement: Patient states that she does not measure blood pressure from home. She checks her blood pressure N/A. She shares her readings with a health care provider: N/A. ?  ?Nutrition: Patient states that on average she eats 2 cups of fruit and 0 cups of vegetables per day. Patient states that she does not eat fish at least 2 times per week. Patient eats less than half servings of whole grains. Patient drinks less than 36 ounces of beverages with added sugar weekly: yes. Patient is currently watching sodium or salt intake: yes. In the past 7 days patient has had 1 drinks containing alcohol. On average patient drinks 1 drinks containing alcohol per day.     ? ?Physical activity:  Patient states that she gets 60 minutes of moderate and 60 minutes of vigorous physical activity each week. ? ?Smoking status:  Patient states that she has has never smoked . ?  ?Quality of life:  Over the past 2 weeks patient states that she had little interest or pleasure in doing things: not at all. She has been feeling down, depressed or hopeless:not at all.  ?  ?Risk reduction and counseling:  ? ?Health Coaching: Spoke with patient about adding more vegetables in her daily diet. Explained to patient that the daily recommendation is for 3 cups of vegetables. Encouraged patient to try and start with 1 serving a day and to increase from there. Gave suggestions for whole grains that patient can try adding into diet (whole wheat bread, whole wheat  pasta, brown rice, oatmeal and whole grain cereals).  Patient has been going to the gym twice a week for an hour. Encouraged patient to continue with exercising. ?  ?Navigation: This was the  follow up session for this patient, I will check up on her progress in the coming months. ? ?Time: 20 minutes  ?

## 2021-10-29 ENCOUNTER — Other Ambulatory Visit: Payer: Self-pay

## 2021-10-29 ENCOUNTER — Inpatient Hospital Stay: Payer: Self-pay | Attending: Obstetrics and Gynecology | Admitting: *Deleted

## 2021-10-29 VITALS — BP 100/70 | Ht 62.0 in | Wt 155.6 lb

## 2021-10-29 DIAGNOSIS — Z0000 Encounter for general adult medical examination without abnormal findings: Secondary | ICD-10-CM

## 2021-10-29 NOTE — Progress Notes (Signed)
[facility] initial screening   Interpreter- [name], Haroldine Laws   Clinical Measurement:  Vitals:   10/29/21 0831  BP: 102/70   Fasting Labs Drawn Today, will review with patient when they result.   Medical History:  Patient states that she does not have high cholesterol, does not have high blood pressure and she does not have diabetes.  Medications:  Patient states that she does not take medication to lower cholesterol, blood pressure or blood sugar.  Patient does not take an aspirin a day to help prevent a heart attack or stroke.    Blood pressure, self measurement: Patient states that she does not measure blood pressure from home. She checks her blood pressure N/A. She shares her readings with a health care provider: N/A.   Nutrition: Patient states that on average she eats 0 cups of fruit and 0 cups of vegetables per day. Patient states that she does not eat fish at least 2 times per week. Patient eats less than half servings of whole grains. Patient drinks less than 36 ounces of beverages with added sugar weekly: yes. Patient is currently watching sodium or salt intake: yes. In the past 7 days patient has consumed drinks containing alcohol on 0 days. On a day that patient consumes drinks containing alcohol on average 0 drinks are consumed.      Physical activity:  Patient states that she gets 105 minutes of moderate and 0 minutes of vigorous physical activity each week.  Smoking status:  Patient states that she has has never smoked .   Quality of life:  Over the past 2 weeks patient states that she had little interest or pleasure in doing things: not at all. She has been feeling down, depressed or hopeless:not at all.    Risk reduction and counseling:   Health Coaching: Spoke with patient about the daily recommendations for fruits and vegetables ( 2 cups of fruit and 3 cups of vegetables). Explained to patient about the benefits of adding heart healthy fish into diet. Gave examples of  heart healthy fish that patient can try (salmon, tuna, mackerel, sardines, sea bass or trout). Patient does not consume whole grains regularly. Gave suggestions for whole grain breads, whole grain cereals, brown rice, oatmeal and whole wheat pasta. Patient has been walking 3-4 days a week for 35 minutes. Encouraged patient to try and getting 20-30 minutes daily.  Goal: Patient would like to practice a more healthy diet. Patient will increase fruit and vegetable intake to 1 serving each day. Patient will work on making this change over the next month.    Navigation:  I will notify patient of lab results.  Patient is aware of 2 more health coaching sessions and a follow up.  Time: 20 minutes

## 2021-10-30 LAB — LIPID PANEL
Chol/HDL Ratio: 3.1 ratio (ref 0.0–4.4)
Cholesterol, Total: 169 mg/dL (ref 100–199)
HDL: 54 mg/dL (ref >39)
LDL Chol Calc (NIH): 99 mg/dL (ref 0–99)
Triglycerides: 84 mg/dL (ref 0–149)
VLDL Cholesterol Cal: 16 mg/dL (ref 5–40)

## 2021-10-30 LAB — GLUCOSE, RANDOM: Glucose: 100 mg/dL — ABNORMAL HIGH (ref 70–99)

## 2021-10-30 LAB — HEMOGLOBIN A1C
Est. average glucose Bld gHb Est-mCnc: 100 mg/dL
Hgb A1c MFr Bld: 5.1 % (ref 4.8–5.6)

## 2021-11-03 ENCOUNTER — Other Ambulatory Visit: Payer: Self-pay

## 2021-11-03 ENCOUNTER — Telehealth: Payer: Self-pay

## 2021-11-03 NOTE — Telephone Encounter (Addendum)
Health coaching 2   interpreter- [name], [employer]   Labs- 169 cholesterol, 99 LDL cholesterol, 84 triglycerides, 54 HDL cholesterol, 5.1 hemoglobin A1C, 100 mean plasma glucose. Patient understands and is aware of her lab results.   Goals-  1. Reduce the amount of fried and fatty foods consumed. Try to grill, bake, broil or sautee foods instead.  2. Reduce the amount of red meats consumed. Substitute for more lean proteins like chicken or fish.  3. Increase the amount of whole grains consumed.  4. Reduce the amount of sweet and sugary food and drinks consumed.  5. Reduce the amount of carbs consumed.    Navigation:  Patient is aware of 1 more health coaching sessions and a follow up. Patient declined referral for FU at this time.   Patient informed me during her result call that during her lab visit she had to drink a sugary drink and wait 1 hour to have her blood drawn. Based on what the patient described I assume that a glucose tolerance test was performed. Informed patient that we would need to bring her back in to have her fasting blood glucose re-drawn. Patient voiced understanding and is scheduled to come back on 11/06/21.     Time-  14 minutes

## 2021-11-06 ENCOUNTER — Other Ambulatory Visit: Payer: Self-pay

## 2021-11-06 DIAGNOSIS — Z0000 Encounter for general adult medical examination without abnormal findings: Secondary | ICD-10-CM

## 2021-11-06 NOTE — Addendum Note (Signed)
Addended by: Meryl Dare on: 11/06/2021 08:27 AM   Modules accepted: Orders

## 2021-11-07 LAB — GLUCOSE, RANDOM: Glucose: 82 mg/dL (ref 70–99)

## 2021-11-10 ENCOUNTER — Telehealth: Payer: Self-pay

## 2021-11-10 NOTE — Telephone Encounter (Signed)
Left message for patient about lab results from [facility]. Left name and number for patient to call back.   

## 2021-11-11 ENCOUNTER — Telehealth: Payer: Self-pay

## 2021-11-11 NOTE — Telephone Encounter (Signed)
Spoke with patient about glucose results. Informed patient that repeat glucose test came back at 82. Informed patient that these results are normal and reassuring. All labs are within normal range. No referral is necessary at this time.

## 2021-11-13 ENCOUNTER — Ambulatory Visit
Admission: RE | Admit: 2021-11-13 | Discharge: 2021-11-13 | Disposition: A | Discharge status: Home / Self Care | Payer: Self-pay | Source: Ambulatory Visit

## 2021-11-13 ENCOUNTER — Ambulatory Visit: Payer: Self-pay | Admitting: Hematology and Oncology

## 2021-11-13 VITALS — BP 100/70 | Wt 155.6 lb

## 2021-11-13 DIAGNOSIS — Z1211 Encounter for screening for malignant neoplasm of colon: Secondary | ICD-10-CM

## 2021-11-13 DIAGNOSIS — Z1231 Encounter for screening mammogram for malignant neoplasm of breast: Secondary | ICD-10-CM

## 2021-11-13 MED ORDER — CEPHALEXIN 500 MG PO CAPS: 500.0000 mg | ORAL_CAPSULE | Freq: Two times a day (BID) | ORAL | 0 refills | Status: DC

## 2021-11-13 NOTE — Progress Notes (Signed)
[name] is a 41 y.o. female who presents to [facility] clinic today with no complaints .    Pap Smear: Pap not smear completed today. Last Pap smear was 01/31/2018 at [facility] clinic and was normal. Per patient has no history of an abnormal Pap smear. Last Pap smear result is available in Epic.   Physical exam: Breasts Breasts symmetrical. No skin abnormalities bilateral breasts. No nipple retraction bilateral breasts. No nipple discharge bilateral breasts. No lymphadenopathy. No lumps palpated bilateral breasts.  Left breast with red area, warm to touch, and tender.   MS DIGITAL DIAG TOMO UNI RIGHT  Result Date: 11/29/2020 CLINICAL DATA:  Screening recall for a possible right breast asymmetry. EXAM: DIGITAL DIAGNOSTIC UNILATERAL RIGHT MAMMOGRAM WITH TOMOSYNTHESIS AND CAD TECHNIQUE: Right digital diagnostic mammography and breast tomosynthesis was performed. The images were evaluated with computer-aided detection. COMPARISON:  Previous exam(s). ACR Breast Density Category c: The breast tissue is heterogeneously dense, which may obscure small masses. FINDINGS: The possible asymmetry, which was noted on the right breast screening MLO view, disperses with spot compression imaging consistent with superimposed fibroglandular tissue. There is no underlying mass or significant residual asymmetry. There are no areas of architectural distortion and there are no suspicious calcifications. IMPRESSION: No evidence of breast malignancy. RECOMMENDATION: Screening mammogram in one year.(Code:SM-B-01Y) I have discussed the findings and recommendations with the patient. If applicable, a reminder letter will be sent to the patient regarding the next appointment. BI-RADS CATEGORY  1: Negative. Electronically Signed   By: Lajean Manes M.D.   On: 11/29/2020 15:29   MM 3D SCREEN BREAST BILATERAL  Result Date: 11/13/2020 CLINICAL DATA:  Screening. EXAM: DIGITAL SCREENING BILATERAL MAMMOGRAM  WITH TOMOSYNTHESIS AND CAD TECHNIQUE: Bilateral screening digital craniocaudal and mediolateral oblique mammograms were obtained. Bilateral screening digital breast tomosynthesis was performed. The images were evaluated with computer-aided detection. COMPARISON:  Previous exam(s). ACR Breast Density Category c: The breast tissue is heterogeneously dense, which may obscure small masses. FINDINGS: In the right breast, a possible asymmetry warrants further evaluation. In the left breast, no findings suspicious for malignancy. IMPRESSION: Further evaluation is suggested for possible asymmetry in the right breast. RECOMMENDATION: Diagnostic mammogram and possibly ultrasound of the right breast. (Code:FI-R-80M) The patient will be contacted regarding the findings, and additional imaging will be scheduled. BI-RADS CATEGORY  0: Incomplete. Need additional imaging evaluation and/or prior mammograms for comparison. Electronically Signed   By: Ammie Ferrier M.D.   On: 11/13/2020 10:18   MS DIGITAL DIAG TOMO BILAT  Result Date: 10/31/2019 CLINICAL DATA:  41 year old female presenting for baseline exam. Patient reports focal intermittent left breast pain for approximately 1 year. EXAM: DIGITAL DIAGNOSTIC BILATERAL MAMMOGRAM WITH CAD AND TOMO ULTRASOUND LEFT BREAST COMPARISON:  None. ACR Breast Density Category c: The breast tissue is heterogeneously dense, which may obscure small masses. FINDINGS: Mammogram: Right breast: No suspicious mass, distortion, or microcalcifications are identified to suggest presence of malignancy. Left breast: A skin BB marks the site of focal pain reported by the patient in the upper outer left breast. Spot compression tomosynthesis views were performed in addition to standard views. There is no abnormality at the site of pain reported by the patient or elsewhere in the left breast. Mammographic images were processed with CAD. On physical exam, I do not feel a discrete mass at the site of pain  reported by the patient. There are no overt skin changes. Ultrasound: Targeted ultrasound is performed at the site of pain reported  by the patient in the left breast at 3 o'clock 8 cm from the nipple demonstrating no suspicious cystic or solid mass or other finding to explain the patient's pain. IMPRESSION: 1. No mammographic or sonographic evidence of malignancy or other abnormality at the site of pain reported by the patient in the outer left breast. 2.  No mammographic evidence of malignancy in the right breast. RECOMMENDATION: 1. Clinical follow-up as needed for the left breast pain. We discussed some of the issues regarding breast pain, including limiting caffeine, wearing adequate support, over-the-counter pain medication, low-fat diet, exercise, and ice as needed. 2.  Screening mammogram in one year.(Code:SM-B-01Y) I have discussed the findings and recommendations with the patient. If applicable, a reminder letter will be sent to the patient regarding the next appointment. BI-RADS CATEGORY  1: Negative. Electronically Signed   By: Audie Pinto M.D.   On: 10/31/2019 10:46      Pelvic/Bimanual Pap is not indicated today    Smoking History: Patient has never smoked and was not referred to quit line.    Patient Navigation: Patient education provided. Access to services provided for patient through [place] interpreter provided. No transportation provided   Colorectal Cancer Screening: Per patient has never had colonoscopy completed No complaints today.    Breast and Cervical Cancer Risk Assessment: Patient does not have family history of breast cancer, known genetic mutations, or radiation treatment to the chest before age 38. Patient does not have history of cervical dysplasia, immunocompromised, or DES exposure in-utero.  Risk Assessment   No risk assessment data for the current encounter  Risk Scores       11/07/2020   Last edited by: Royston Bake, CMA    5-year risk: 0.3 %   Lifetime risk: 5.1 %            A: [facility] exam without pap smear No complaints, benign exam. Will send Keflex for skin infection.   P: [facility]. Appointment scheduled 11/13/2021.  Melodye Ped, NP 11/13/2021 2:27 PM

## 2022-03-24 ENCOUNTER — Telehealth: Payer: Self-pay

## 2022-03-24 NOTE — Telephone Encounter (Addendum)
Health Coaching 3: 03/23/22  interpreter- Rudene Anda, [employer]   Goals- Patient would like to practice a more healthy diet. Patient will increase fruit and vegetable intake to 1 serving each day. Patient will work on making this change over the next month.     New goal- Spoke with patient in detail about diet. New goal is to increase fruit and vegetable intake to 1 serving of each per day.   Barrier to reaching goal-    Strategies to overcome- Try juicing or smoothies to add more fruits and vegetables into diet.    Navigation:  Patient is aware of  a follow up session. Patient is scheduled for FU appt on 04/27/22 @ 3:15 pm.   Time- 10 minutes

## 2022-04-27 ENCOUNTER — Ambulatory Visit: Payer: Self-pay

## 2022-10-06 ENCOUNTER — Other Ambulatory Visit: Payer: Self-pay

## 2022-10-06 DIAGNOSIS — Z1231 Encounter for screening mammogram for malignant neoplasm of breast: Secondary | ICD-10-CM

## 2022-11-18 ENCOUNTER — Other Ambulatory Visit: Payer: Self-pay

## 2022-11-18 ENCOUNTER — Inpatient Hospital Stay: Payer: Self-pay | Attending: Obstetrics and Gynecology | Admitting: *Deleted

## 2022-11-18 VITALS — BP 100/64 | Ht 64.0 in | Wt 158.1 lb

## 2022-11-18 DIAGNOSIS — Z0000 Encounter for general adult medical examination without abnormal findings: Secondary | ICD-10-CM
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
## 2022-11-19 LAB — HEMOGLOBIN A1C
Est. average glucose Bld gHb Est-mCnc: 103 mg/dL
Hgb A1c MFr Bld: 5.2 % (ref 4.8–5.6)

## 2022-11-19 LAB — LIPID PANEL
Chol/HDL Ratio: 3.8 ratio (ref 0.0–4.4)
Cholesterol, Total: 171 mg/dL (ref 100–199)
HDL: 45 mg/dL (ref >39)
LDL Chol Calc (NIH): 109 mg/dL — ABNORMAL HIGH (ref 0–99)
Triglycerides: 91 mg/dL (ref 0–149)
VLDL Cholesterol Cal: 17 mg/dL (ref 5–40)

## 2022-11-19 LAB — GLUCOSE, RANDOM: Glucose: 86 mg/dL (ref 70–99)

## 2022-11-23 ENCOUNTER — Telehealth: Payer: Self-pay

## 2022-11-23 NOTE — Telephone Encounter (Signed)
Health coaching 2   interpreter- [name], [facility]   Labs- 171 cholesterol, 109 LDL cholesterol, 91 triglycerides, 45 HDL cholesterol,  5.2 hemoglobin A1C, 86 mean plasma glucose. Patient understands and is aware of her lab results.   Goals-  1. Decrease the amount of fried and fatty foods consumed. Try to grill, bake, broil or sautee foods instead. 2. Decrease the amount of red meats consumed. Try to substitute for lean proteins like chicken or fish instead. 3. Decrease the amount of whole fat dairy products consumed. Look for low-fat or reduced-fat options instead. 4. Continue with daily exercise.   Navigation:  Patient is aware of 1 more health coaching sessions and a follow up. Will refer patient to Internal Medicine for FU for elevated labs. Will call patient with appointment information once scheduled.  Time- 10 minutes

## 2022-11-26 ENCOUNTER — Ambulatory Visit: Payer: Self-pay | Admitting: Hematology and Oncology

## 2022-11-26 ENCOUNTER — Ambulatory Visit
Admission: RE | Admit: 2022-11-26 | Discharge: 2022-11-26 | Disposition: A | Discharge status: Home / Self Care | Payer: No Typology Code available for payment source | Source: Ambulatory Visit | Attending: Obstetrics and Gynecology | Admitting: Obstetrics and Gynecology

## 2022-11-26 VITALS — BP 104/67 | Wt 156.0 lb

## 2022-11-26 DIAGNOSIS — Z1231 Encounter for screening mammogram for malignant neoplasm of breast: Secondary | ICD-10-CM

## 2022-11-26 NOTE — Patient Instructions (Signed)
Taught Nonie Hoyer Serrano-Revuelta about self breast awareness and gave educational materials to take home. Patient did not need a Pap smear today due to last Pap smear was in 01/31/2018 per patient. Let her know [facility] will cover Pap smears every 5 years unless has a history of abnormal Pap smears. Referred patient to [facility] for screening mammogram. Appointment scheduled for 11/26/2022. Patient aware of appointment and will be there. Let patient know will follow up with her within the next couple weeks with results. Isabellamarie Delia Serrano-Revuelta verbalized understanding.  Pascal Lux, NP 2:44 PM

## 2022-11-26 NOTE — Progress Notes (Signed)
[name] is a 42 y.o. female who presents to [facility] clinic today with no complaints.    Pap Smear: Pap not smear completed today. Last Pap smear was 01/31/2018 and was normal. Per patient has no history of an abnormal Pap smear. Last Pap smear result is available in Epic. Will repeat Pap smear in December this year.    Physical exam: Breasts Breasts symmetrical. No skin abnormalities bilateral breasts. No nipple retraction bilateral breasts. No nipple discharge bilateral breasts. No lymphadenopathy. No lumps palpated bilateral breasts.  MS DIGITAL SCREENING TOMO BILATERAL  Result Date: 11/14/2021 CLINICAL DATA:  Screening. EXAM: DIGITAL SCREENING BILATERAL MAMMOGRAM WITH TOMOSYNTHESIS AND CAD TECHNIQUE: Bilateral screening digital craniocaudal and mediolateral oblique mammograms were obtained. Bilateral screening digital breast tomosynthesis was performed. The images were evaluated with computer-aided detection. COMPARISON:  Previous exam(s). ACR Breast Density Category d: The breast tissue is extremely dense, which lowers the sensitivity of mammography FINDINGS: There are no findings suspicious for malignancy. IMPRESSION: No mammographic evidence of malignancy. A result letter of this screening mammogram will be mailed directly to the patient. RECOMMENDATION: Screening mammogram in one year. (Code:SM-B-01Y) BI-RADS CATEGORY  1: Negative. Electronically Signed   By: Sherian Rein M.D.   On: 11/14/2021 15:26   MS DIGITAL DIAG TOMO UNI RIGHT  Result Date: 11/29/2020 CLINICAL DATA:  Screening recall for a possible right breast asymmetry. EXAM: DIGITAL DIAGNOSTIC UNILATERAL RIGHT MAMMOGRAM WITH TOMOSYNTHESIS AND CAD TECHNIQUE: Right digital diagnostic mammography and breast tomosynthesis was performed. The images were evaluated with computer-aided detection. COMPARISON:  Previous exam(s). ACR Breast Density Category c: The breast tissue is heterogeneously dense, which may obscure  small masses. FINDINGS: The possible asymmetry, which was noted on the right breast screening MLO view, disperses with spot compression imaging consistent with superimposed fibroglandular tissue. There is no underlying mass or significant residual asymmetry. There are no areas of architectural distortion and there are no suspicious calcifications. IMPRESSION: No evidence of breast malignancy. RECOMMENDATION: Screening mammogram in one year.(Code:SM-B-01Y) I have discussed the findings and recommendations with the patient. If applicable, a reminder letter will be sent to the patient regarding the next appointment. BI-RADS CATEGORY  1: Negative. Electronically Signed   By: Amie Portland M.D.   On: 11/29/2020 15:29   MM 3D SCREEN BREAST BILATERAL  Result Date: 11/13/2020 CLINICAL DATA:  Screening. EXAM: DIGITAL SCREENING BILATERAL MAMMOGRAM WITH TOMOSYNTHESIS AND CAD TECHNIQUE: Bilateral screening digital craniocaudal and mediolateral oblique mammograms were obtained. Bilateral screening digital breast tomosynthesis was performed. The images were evaluated with computer-aided detection. COMPARISON:  Previous exam(s). ACR Breast Density Category c: The breast tissue is heterogeneously dense, which may obscure small masses. FINDINGS: In the right breast, a possible asymmetry warrants further evaluation. In the left breast, no findings suspicious for malignancy. IMPRESSION: Further evaluation is suggested for possible asymmetry in the right breast. RECOMMENDATION: Diagnostic mammogram and possibly ultrasound of the right breast. (Code:FI-R-30M) The patient will be contacted regarding the findings, and additional imaging will be scheduled. BI-RADS CATEGORY  0: Incomplete. Need additional imaging evaluation and/or prior mammograms for comparison. Electronically Signed   By: Frederico Hamman M.D.   On: 11/13/2020 10:18   MS DIGITAL DIAG TOMO BILAT  Result Date: 10/31/2019 CLINICAL DATA:  42 year old female presenting  for baseline exam. Patient reports focal intermittent left breast pain for approximately 1 year. EXAM: DIGITAL DIAGNOSTIC BILATERAL MAMMOGRAM WITH CAD AND TOMO ULTRASOUND LEFT BREAST COMPARISON:  None. ACR Breast Density Category c: The breast tissue is heterogeneously dense,  which may obscure small masses. FINDINGS: Mammogram: Right breast: No suspicious mass, distortion, or microcalcifications are identified to suggest presence of malignancy. Left breast: A skin BB marks the site of focal pain reported by the patient in the upper outer left breast. Spot compression tomosynthesis views were performed in addition to standard views. There is no abnormality at the site of pain reported by the patient or elsewhere in the left breast. Mammographic images were processed with CAD. On physical exam, I do not feel a discrete mass at the site of pain reported by the patient. There are no overt skin changes. Ultrasound: Targeted ultrasound is performed at the site of pain reported by the patient in the left breast at 3 o'clock 8 cm from the nipple demonstrating no suspicious cystic or solid mass or other finding to explain the patient's pain. IMPRESSION: 1. No mammographic or sonographic evidence of malignancy or other abnormality at the site of pain reported by the patient in the outer left breast. 2.  No mammographic evidence of malignancy in the right breast. RECOMMENDATION: 1. Clinical follow-up as needed for the left breast pain. We discussed some of the issues regarding breast pain, including limiting caffeine, wearing adequate support, over-the-counter pain medication, low-fat diet, exercise, and ice as needed. 2.  Screening mammogram in one year.(Code:SM-B-01Y) I have discussed the findings and recommendations with the patient. If applicable, a reminder letter will be sent to the patient regarding the next appointment. BI-RADS CATEGORY  1: Negative. Electronically Signed   By: Emmaline Kluver M.D.   On: 10/31/2019  10:46         Pelvic/Bimanual Pap is not indicated today    Smoking History: Patient has never smoked and was not referred to quit line.    Patient Navigation: Patient education provided. Access to services provided for patient through BCCCP program. [name] interpreter provided. No transportation provided   Colorectal Cancer Screening: Per patient has never had colonoscopy completed No complaints today.    Breast and Cervical Cancer Risk Assessment: Patient does not have family history of breast cancer, known genetic mutations, or radiation treatment to the chest before age 65. Patient does not have history of cervical dysplasia, immunocompromised, or DES exposure in-utero.  Risk Scores as of Encounter on 11/26/2022     Dondra Spry           5-year 0.51%   Lifetime 7.14%   This patient is [place] but has no documented birth country, so the Wright model used data from [place] patients to calculate their risk score. Document a birth country in the Demographics activity for a more accurate score.         Last calculated by Caprice Red, CMA on 11/26/2022 at  2:33 PM        A: BCCCP exam without pap smear No complaints with benign exam.   P: Referred patient to [facility] for a screening mammogram. Appointment scheduled 11/26/2022.  Pascal Lux, NP 11/26/2022 2:43 PM

## 2022-12-16 ENCOUNTER — Encounter: Payer: Self-pay | Admitting: Student

## 2023-01-29 ENCOUNTER — Other Ambulatory Visit: Payer: Self-pay | Admitting: Hematology and Oncology

## 2023-01-29 DIAGNOSIS — Z124 Encounter for screening for malignant neoplasm of cervix: Secondary | ICD-10-CM

## 2023-01-29 NOTE — Progress Notes (Signed)
Patient: Kristen Davila           Date of Birth: 28-Mar-1980           MRN: [id number] Visit Date: 01/29/2023 PCP: Catalina Antigua, MD  Cervical Cancer Screening Do you smoke?: No Have you ever had or been told you have an allergy to latex products?: No Marital status: Married Date of last pap smear: 2-5 yrs ago Date of last menstrual period: 12/31/22 Number of pregnancies: 4 Number of births: 4 Have you ever had any of the following? Hysterectomy: No Tubal ligation (tubes tied): No Abnormal bleeding: No Abnormal pap smear: No Venereal warts: No A sex partner with venereal warts: No A high risk* sex partner: No  Cervical Exam Pap smear completed: Pap test Abnormal Observations: Normal Recommendations: Will repeat in 5 years if normal and HPV-. If abnormal, will schedule for colposcopy.       Patient's History Patient Active Problem List   Diagnosis Date Noted   Breast pain 09/26/2019   Vaginal lesion 07/21/2018   Chest pain 03/23/2018   External hemorrhoids 01/31/2018   Deep vein thrombosis (DVT) of left lower extremity (HCC) 11/30/2017   Dizziness 03/03/2016   Depression 03/03/2016   History of poor fetal growth 01/16/2014   RUQ abdominal pain 06/28/2013   Solar lentigo 05/29/2011   Acne 05/29/2011   Past Medical History:  Diagnosis Date   Constipation    Headache(784.0)    Hemorrhoids    Thrombosis    Varicose vein     Family History  Problem Relation Age of Onset   Hypertension Father    Breast cancer Neg Hx     Social History   Occupational History   Not on file  Tobacco Use   Smoking status: Never   Smokeless tobacco: Never  Vaping Use   Vaping status: Never Used  Substance and Sexual Activity   Alcohol use: Yes    Comment: occ.   Drug use: No   Sexual activity: Yes    Birth control/protection: Condom

## 2023-02-01 ENCOUNTER — Other Ambulatory Visit: Payer: Self-pay | Admitting: Hematology and Oncology

## 2023-02-01 MED ORDER — FLUCONAZOLE 150 MG PO TABS: 150.0000 mg | ORAL_TABLET | Freq: Every day | ORAL | 0 refills | Status: DC

## 2023-02-02 ENCOUNTER — Telehealth: Payer: Self-pay

## 2023-02-02 NOTE — Telephone Encounter (Signed)
Left message for patient via [name] about lab results. Left name and number for patient to call back.

## 2023-02-02 NOTE — Telephone Encounter (Signed)
Via, Rito Ehrlich, [facility] Spanish Interpreter, Patient informed negative Pap/HPV results, next pap due in 5 years, wet prep results positive for yeast infection, rx diflucan sent [employer]. Patient informed to take 1 tablet today, and if not better within 1 week, needs to take 2nd tablet. Discussed causes of yeast infection, suggestions how to help decrease or prevent, call back if worsens or not improving, verbalized understanding.

## 2023-02-05 LAB — CYTOLOGY - PAP
Comment: NEGATIVE
Diagnosis: NEGATIVE
High risk HPV: NEGATIVE

## 2023-02-05 LAB — CERVICOVAGINAL ANCILLARY ONLY
Bacterial Vaginitis (gardnerella): NEGATIVE
Candida Glabrata: NEGATIVE
Candida Vaginitis: POSITIVE — AB
Comment: NEGATIVE
Comment: NEGATIVE
Comment: NEGATIVE
Comment: NEGATIVE
Trichomonas: NEGATIVE

## 2023-09-07 ENCOUNTER — Telehealth: Payer: Self-pay

## 2023-09-07 NOTE — Telephone Encounter (Signed)
 Health Coaching 3  interpreter- [employer]   Goals- Patient is still exercising at least 5 days per week for at least 20-30 minutes. Patient is still working on adding more fruits and vegetables to daily diet. Encouraged patient to try and get at least one serving of fruits and vegetables daily if possible. Patient is watching the amount of fried and fatty foods that she consumes.      Navigation:  Patient is aware of a follow up session.Patient is scheduled for FU on 10/08/23.

## 2023-10-08 ENCOUNTER — Other Ambulatory Visit: Payer: Self-pay

## 2023-10-08 ENCOUNTER — Inpatient Hospital Stay: Attending: Obstetrics and Gynecology | Admitting: *Deleted

## 2023-10-08 VITALS — BP 97/70 | Ht 64.0 in | Wt 154.0 lb

## 2023-10-08 DIAGNOSIS — Z0000 Encounter for general adult medical examination without abnormal findings: Secondary | ICD-10-CM

## 2023-10-08 DIAGNOSIS — Z1231 Encounter for screening mammogram for malignant neoplasm of breast: Secondary | ICD-10-CM

## 2023-10-08 NOTE — Progress Notes (Signed)
 [facility]woman follow up   Interpreter: Francee Sprung, [place]  Clinical Measurement:   Vitals:   10/08/23 0828 10/08/23 0923  BP: 99/64 97/70      Medical History: Patient states that she does not have high cholesterol, does not have high blood pressure and she does not have diabetes. Patient states that she does not have history of gestational hypertension, does not have history of pre-eclampsia/eclampsia and she does not have history of gestational diabetes.     Medications: Patient states that she does not take medication to lower cholesterol, blood pressure and blood sugar.  Patient does not take an aspirin a day to help prevent a heart attack or stroke.    Blood pressure, self measurement: Patient states that she does not measure blood pressure from home. She checks her blood pressure N/A. She shares her readings with a health care provider: N/A.   Nutrition: Patient states that on average she eats 1 cups of fruit and 1 cups of vegetables per day. Patient states that she does not eat fish at least 2 times per week. Patient eats about half servings of whole grains. Patient drinks less than 36 ounces of beverages with added sugar weekly: yes. Patient is currently watching sodium or salt intake: yes. In the past 7 days patient has had 0 drinks containing alcohol. On average patient drinks 1 drinks containing alcohol per day.      Physical activity: Patient states that she gets 150 minutes of moderate and 0 minutes of vigorous physical activity each week.  Smoking status: Patient states that she has has never smoked .   Quality of life: Over the past 2 weeks patient states that she had little interest or pleasure in doing things: not at all. She has been feeling down, depressed or hopeless:not at all.   Social Determinants of Health Assessment:   Computer Use: During the last 12 months patient states that she has used any of the following: desktop/laptop, smart phone or tablet/other portable  wireless computer: yes.   Internet Use: During the last 12 months, did you or any member of your household have access to the internet: Yes, by paying a cell phone company or internet service provider.   Food Insecurities: During the last 12 months, where there any times when you were worried that you would run out of food because of a lack of money or other resources: No.   Transportation Barriers: During the last 12 months, have you missed a doctor's appointment because of transportation problems: No.   Childcare Barriers: If you are currently using childcare services, please identify  the type of services you use. (If not using childcare services, please select Not applicable): not applicable. During the last 12 months, have you had any barriers to childcare services such as: not applicable.   Housing: What is your housing situation today: I have housing.   Intimate Partner Violence: During the last 12 months, how often did your partner physically hurt you: never. During the last 12 months, how often did your partner insult you or talk down to you: never.  Medication Adherence: During the last 12 months, did you ever forget to take your medicine: not applicable. During the last 12 months, were you careless ar times about taking your medicine: not applicable. During the last 12 months, when you felt better did you sometimes stop taking your medication: not applicable. During the last 12 months, sometimes if you felt worse when you took your medicine did you stop  taking it: not applicable.    Risk reduction and counseling: Spoke with patient about adding more heart healthy fish into her regular diet. Gave suggestions for salmon, tuna, mackerel, sea bass, trout or sardines. Spoke with patient about the daily recommendations for fruits and vegetables.     Navigation: This was the  follow up session for this patient, I will check up on her progress in the coming months.

## 2023-11-25 NOTE — Progress Notes (Unsigned)
 [facility]woman Re-screening   Interpreter- Bernice Angry, [place]   Clinical Measurement:  Vitals:   11/26/23 0841 11/26/23 0842  BP: 99/73 108/69   Fasting Labs Drawn Today, will review with patient when they result.   Medical History: Patient states that she does not have high cholesterol, does not have high blood pressure and she does not have diabetes. Patient states that she does not have history of gestational hypertension, does not have history of pre-eclampsia/eclampsia and she does not have history of gestational diabetes.    Medications: Patient states that she does not take medication to lower cholesterol, blood pressure or blood sugar.  Patient does not take an aspirin a day to help prevent a heart attack or stroke.   Blood pressure, self measurement: Patient states that she does not measure blood pressure from home. She checks her blood pressure N/A. She shares her readings with a health care provider: N/A.   Nutrition: Patient states that on average she eats 1 cups of fruit and 1 cups of vegetables per day. Patient states that she does not eat fish at least 2 times per week. Patient eats about half servings of whole grains. Patient drinks less than 36 ounces of beverages with added sugar weekly: yes. Patient is currently watching sodium or salt intake: yes. In the past 7 days patient has consumed drinks containing alcohol on 0 days. On a day that patient consumes drinks containing alcohol on average 2-3 drinks are consumed.      Physical activity: Patient states that she gets 160 minutes of physical activity each week.  Smoking status: Patient states that she has has never smoked .   Quality of life: Over the past 2 weeks patient states that she had little interest or pleasure in doing things: not at all. She has been feeling down, depressed or hopeless:not at all.   Social Determinants of Health Assessment:   Computer Use: During the last 12 months patient states that she has used  any of the following: desktop/laptop, smart phone or tablet/other portable wireless computer: yes.   Internet Use: During the last 12 months, did you or any member of your household have access to the internet: Yes, by paying a cell phone company or internet service provider.   Food Insecurities: During the last 12 months, where there any times when you were worried that you would run out of food because of a lack of money or other resources: No.   Transportation Barriers: During the last 12 months, have you missed a doctor's appointment because of transportation problems: No.   Childcare Barriers: If you are currently using childcare services, please identify  the type of services you use. (If not using childcare services, please select Not applicable): not applicable. During the last 12 months, have you had any barriers to childcare services such as: not applicable.   Housing: What is your housing situation today: I have housing.   Intimate Partner Violence: During the last 12 months, how often did your partner physically hurt you: never. During the last 12 months, how often did your partner insult you or talk down to you: never.  Medication Adherence: During the last 12 months, did you ever forget to take your medicine: not applicable. During the last 12 months, were you careless ar times about taking your medicine: not applicable. During the last 12 months, when you felt better did you sometimes stop taking your medication: not applicable. During the last 12 months, sometimes if you felt worse  when you took your medicine did you stop taking it: not applicable.   Risk reduction and counseling:   Health Coaching: Spoke with patient about the daily recommendations for fruits and vegetables. Showed patient what a serving size would look like. Patient consumes 2 servings of salmon weekly. Patient consumes whole wheat bread and oatmeal and at times whole wheat pasta. Patient does not drink  beverages that are sweetened with sugar. Patient has been walking 4-5 days per week for 40 minutes.  Goal: Increase vegetable servings from one serving daily to two servings daily. Patient will work on reaching this goal over the next month.   Navigation:  I will notify patient of lab results.  Patient is aware of 2 more health coaching sessions and a follow up.

## 2023-11-26 ENCOUNTER — Other Ambulatory Visit: Payer: Self-pay

## 2023-11-26 ENCOUNTER — Inpatient Hospital Stay: Attending: Obstetrics and Gynecology | Admitting: *Deleted

## 2023-11-26 VITALS — BP 108/69 | Ht 64.0 in | Wt 156.4 lb

## 2023-11-26 DIAGNOSIS — Z0000 Encounter for general adult medical examination without abnormal findings: Secondary | ICD-10-CM

## 2023-11-27 LAB — LIPID PANEL
Chol/HDL Ratio: 3.7 ratio (ref 0.0–4.4)
Cholesterol, Total: 177 mg/dL (ref 100–199)
HDL: 48 mg/dL (ref >39)
LDL Chol Calc (NIH): 113 mg/dL — ABNORMAL HIGH (ref 0–99)
Triglycerides: 86 mg/dL (ref 0–149)
VLDL Cholesterol Cal: 16 mg/dL (ref 5–40)

## 2023-11-27 LAB — HEMOGLOBIN A1C
Est. average glucose Bld gHb Est-mCnc: 97 mg/dL
Hgb A1c MFr Bld: 5 % (ref 4.8–5.6)

## 2023-11-27 LAB — GLUCOSE, RANDOM: Glucose: 85 mg/dL (ref 70–99)

## 2023-12-03 ENCOUNTER — Ambulatory Visit: Payer: Self-pay

## 2023-12-03 NOTE — Telephone Encounter (Signed)
 Spoke with patient's daughter about lab work from [employer]. Patient's phone number is not working currently. Her daughter will pass along the message to her mother to call me back to go over lab results.

## 2023-12-06 NOTE — Telephone Encounter (Signed)
 Health coaching 2   interpreter- [employer] [phone]   Labs- 177 cholesterol, 113 LDL cholesterol, 48 HDL cholesterol, 86 triglycerides, 5.0 hemoglobin A1C, 85 mean plasma glucose. Patient understands and is aware of her lab results.   Goals-  1.Continue watching diet. 2. Avoid/ reduce fried and fatty foods. 3. Decrease the amount of red meat consumed. Increase lean protein intake. 4. Continue with walking. Goal of at least 30 minutes daily.    Navigation:  Patient is aware of 1 more health coaching sessions and a follow up.

## 2023-12-29 NOTE — Progress Notes (Unsigned)
 [name] is a 43 y.o. female who presents to [facility] clinic today with {Blank single:19197::no complaints,complaint of} ***.    Pap Smear: Pap smear completed today. Last Pap smear was 01/29/2023 at [facility] and was normal with negative HPV. Per patient has no history of an abnormal Pap smear. Last Pap smear result is available in Epic.   Physical exam: Breasts Breasts symmetrical. No skin abnormalities bilateral breasts. No nipple retraction bilateral breasts. No nipple discharge bilateral breasts. No lymphadenopathy. No lumps palpated bilateral breasts.      MS 3D SCR MAMMO BILAT BR (aka MM) Result Date: 12/01/2022 CLINICAL DATA:  Screening. EXAM: DIGITAL SCREENING BILATERAL MAMMOGRAM WITH TOMOSYNTHESIS AND CAD TECHNIQUE: Bilateral screening digital craniocaudal and mediolateral oblique mammograms were obtained. Bilateral screening digital breast tomosynthesis was performed. The images were evaluated with computer-aided detection. COMPARISON:  Previous exam(s). ACR Breast Density Category d: The breasts are extremely dense, which lowers the sensitivity of mammography. FINDINGS: There are no findings suspicious for malignancy. IMPRESSION: No mammographic evidence of malignancy. A result letter of this screening mammogram will be mailed directly to the patient. RECOMMENDATION: Screening mammogram in one year. (Code:SM-B-01Y) BI-RADS CATEGORY  1: Negative. Electronically Signed   By: Rosaline Collet M.D.   On: 12/01/2022 07:30   MS DIGITAL SCREENING TOMO BILATERAL Result Date: 11/14/2021 CLINICAL DATA:  Screening. EXAM: DIGITAL SCREENING BILATERAL MAMMOGRAM WITH TOMOSYNTHESIS AND CAD TECHNIQUE: Bilateral screening digital craniocaudal and mediolateral oblique mammograms were obtained. Bilateral screening digital breast tomosynthesis was performed. The images were evaluated with computer-aided detection. COMPARISON:  Previous exam(s). ACR Breast  Density Category d: The breast tissue is extremely dense, which lowers the sensitivity of mammography FINDINGS: There are no findings suspicious for malignancy. IMPRESSION: No mammographic evidence of malignancy. A result letter of this screening mammogram will be mailed directly to the patient. RECOMMENDATION: Screening mammogram in one year. (Code:SM-B-01Y) BI-RADS CATEGORY  1: Negative. Electronically Signed   By: Craig Farr M.D.   On: 11/14/2021 15:26   MS DIGITAL DIAG TOMO UNI RIGHT Result Date: 11/29/2020 CLINICAL DATA:  Screening recall for a possible right breast asymmetry. EXAM: DIGITAL DIAGNOSTIC UNILATERAL RIGHT MAMMOGRAM WITH TOMOSYNTHESIS AND CAD TECHNIQUE: Right digital diagnostic mammography and breast tomosynthesis was performed. The images were evaluated with computer-aided detection. COMPARISON:  Previous exam(s). ACR Breast Density Category c: The breast tissue is heterogeneously dense, which may obscure small masses. FINDINGS: The possible asymmetry, which was noted on the right breast screening MLO view, disperses with spot compression imaging consistent with superimposed fibroglandular tissue. There is no underlying mass or significant residual asymmetry. There are no areas of architectural distortion and there are no suspicious calcifications. IMPRESSION: No evidence of breast malignancy. RECOMMENDATION: Screening mammogram in one year.(Code:SM-B-01Y) I have discussed the findings and recommendations with the patient. If applicable, a reminder letter will be sent to the patient regarding the next appointment. BI-RADS CATEGORY  1: Negative. Electronically Signed   By: Alm Parkins M.D.   On: 11/29/2020 15:29   MM 3D SCREEN BREAST BILATERAL Result Date: 11/13/2020 CLINICAL DATA:  Screening. EXAM: DIGITAL SCREENING BILATERAL MAMMOGRAM WITH TOMOSYNTHESIS AND CAD TECHNIQUE: Bilateral screening digital craniocaudal and mediolateral oblique mammograms were obtained. Bilateral screening  digital breast tomosynthesis was performed. The images were evaluated with computer-aided detection. COMPARISON:  Previous exam(s). ACR Breast Density Category c: The breast tissue is heterogeneously dense, which may obscure small masses. FINDINGS: In the right breast, a possible asymmetry warrants further evaluation. In the  left breast, no findings suspicious for malignancy. IMPRESSION: Further evaluation is suggested for possible asymmetry in the right breast. RECOMMENDATION: Diagnostic mammogram and possibly ultrasound of the right breast. (Code:[id number]-71M) The patient will be contacted regarding the findings, and additional imaging will be scheduled. BI-RADS CATEGORY  0: Incomplete. Need additional imaging evaluation and/or prior mammograms for comparison. Electronically Signed   By: Rosaline Collet M.D.   On: 11/13/2020 10:18   MS DIGITAL DIAG TOMO BILAT Result Date: 10/31/2019 CLINICAL DATA:  43 year old female presenting for baseline exam. Patient reports focal intermittent left breast pain for approximately 1 year. EXAM: DIGITAL DIAGNOSTIC BILATERAL MAMMOGRAM WITH CAD AND TOMO ULTRASOUND LEFT BREAST COMPARISON:  None. ACR Breast Density Category c: The breast tissue is heterogeneously dense, which may obscure small masses. FINDINGS: Mammogram: Right breast: No suspicious mass, distortion, or microcalcifications are identified to suggest presence of malignancy. Left breast: A skin BB marks the site of focal pain reported by the patient in the upper outer left breast. Spot compression tomosynthesis views were performed in addition to standard views. There is no abnormality at the site of pain reported by the patient or elsewhere in the left breast. Mammographic images were processed with CAD. On physical exam, I do not feel a discrete mass at the site of pain reported by the patient. There are no overt skin changes. Ultrasound: Targeted ultrasound is performed at the site of pain reported by the patient in  the left breast at 3 o'clock 8 cm from the nipple demonstrating no suspicious cystic or solid mass or other finding to explain the patient's pain. IMPRESSION: 1. No mammographic or sonographic evidence of malignancy or other abnormality at the site of pain reported by the patient in the outer left breast. 2.  No mammographic evidence of malignancy in the right breast. RECOMMENDATION: 1. Clinical follow-up as needed for the left breast pain. We discussed some of the issues regarding breast pain, including limiting caffeine, wearing adequate support, over-the-counter pain medication, low-fat diet, exercise, and ice as needed. 2.  Screening mammogram in one year.(Code:SM-B-01Y) I have discussed the findings and recommendations with the patient. If applicable, a reminder letter will be sent to the patient regarding the next appointment. BI-RADS CATEGORY  1: Negative. Electronically Signed   By: Inocente Ast M.D.   On: 10/31/2019 10:46    Pelvic/Bimanual Pap is not indicated today per BCCCP guidelines.   Smoking History: Patient has never smoked.   Patient Navigation: Patient education provided. Access to services provided for patient through [facility] program. *** interpreter provided. *** transportation provided   Colorectal Cancer Screening: Per patient {Blank single:19197::has had colonoscopy completed on ***,has never had colonoscopy completed} No complaints today.    Breast and Cervical Cancer Risk Assessment: Patient does not have family history of breast cancer, known genetic mutations, or radiation treatment to the chest before age 22. Patient does not have history of cervical dysplasia, immunocompromised, or DES exposure in-utero.  Risk Scores as of Encounter on 12/30/2023     [place] as of 11/26/2022           5-year 0.51%   Lifetime 7.14%   This patient is [place] but has no documented birth country, so the [place] model used data from [place] patients to calculate their risk score.  Document a birth country in the Demographics activity for a more accurate score.         Last calculated by Silas, Ansyi K, CMA on 11/26/2022 at  2:33 PM  A: BCCCP exam without pap smear Complaint of ***  P: Referred patient to the [facility] for a screening mammogram on mobile unit. Appointment scheduled Thursday, December 30, 2023 at 1540.  Driscilla Wanda SQUIBB, RN 12/29/2023 12:57 PM

## 2023-12-30 ENCOUNTER — Ambulatory Visit: Payer: Self-pay | Admitting: *Deleted

## 2023-12-30 ENCOUNTER — Ambulatory Visit
Admission: RE | Admit: 2023-12-30 | Discharge: 2023-12-30 | Disposition: A | Discharge status: Home / Self Care | Source: Ambulatory Visit | Attending: Obstetrics and Gynecology | Admitting: Obstetrics and Gynecology

## 2023-12-30 VITALS — BP 104/67 | Wt 158.0 lb

## 2023-12-30 DIAGNOSIS — Z1231 Encounter for screening mammogram for malignant neoplasm of breast: Secondary | ICD-10-CM

## 2023-12-30 DIAGNOSIS — Z1239 Encounter for other screening for malignant neoplasm of breast: Secondary | ICD-10-CM

## 2023-12-30 NOTE — Patient Instructions (Signed)
 Explained breast self awareness with Orene Pate Serrano-Revuelta. Patient did not need a Pap smear today due to last Pap smear and HPV typing was 01/29/2023. Let her know BCCCP will cover Pap smears and HPV typing every 5 years unless has a history of abnormal Pap smears. Referred patient to the [facility] for a screening mammogram on mobile unit. Appointment scheduled Thursday, December 30, 2023 at 1540. Patient aware of appointment and will be there. Let patient know the Breast Center will follow up with her within the next couple weeks with results of her mammogram by letter or phone. Simonne Delia Serrano-Revuelta verbalized understanding.  Justyn Boyson, Wanda Ship, RN 3:06 PM

## 2024-01-04 ENCOUNTER — Ambulatory Visit: Payer: Self-pay | Admitting: Obstetrics & Gynecology
